# Patient Record
Sex: Female | Born: 1970
Health system: Southern US, Community
[De-identification: ages and names within clinical notes are randomized; demographics above are authoritative.]

## PROBLEM LIST (undated history)

## (undated) DIAGNOSIS — A6009 Herpesviral infection of other urogenital tract: Secondary | ICD-10-CM

## (undated) DIAGNOSIS — F419 Anxiety disorder, unspecified: Secondary | ICD-10-CM

## (undated) DIAGNOSIS — C801 Malignant (primary) neoplasm, unspecified: Secondary | ICD-10-CM

## (undated) HISTORY — DX: Anxiety disorder, unspecified: F41.9

## (undated) HISTORY — PX: TUBAL LIGATION: SHX77

## (undated) HISTORY — DX: Malignant (primary) neoplasm, unspecified: C80.1

## (undated) HISTORY — PX: ANTERIOR CRUCIATE LIGAMENT REPAIR: SHX115

---

## 1993-03-17 DIAGNOSIS — C801 Malignant (primary) neoplasm, unspecified: Secondary | ICD-10-CM

## 1993-03-17 HISTORY — DX: Malignant (primary) neoplasm, unspecified: C80.1

## 2005-07-14 ENCOUNTER — Inpatient Hospital Stay (HOSPITAL_COMMUNITY): Admission: AD | Admit: 2005-07-14 | Discharge: 2005-07-23 | Payer: Self-pay | Admitting: Gynecology

## 2005-07-14 ENCOUNTER — Ambulatory Visit: Payer: Self-pay | Admitting: Neonatology

## 2005-07-14 ENCOUNTER — Ambulatory Visit: Payer: Self-pay | Admitting: Family Medicine

## 2005-07-19 ENCOUNTER — Encounter (INDEPENDENT_AMBULATORY_CARE_PROVIDER_SITE_OTHER): Payer: Self-pay | Admitting: Specialist

## 2005-07-28 ENCOUNTER — Ambulatory Visit: Payer: Self-pay | Admitting: Family Medicine

## 2005-08-27 ENCOUNTER — Ambulatory Visit: Payer: Self-pay | Admitting: Obstetrics and Gynecology

## 2005-11-20 ENCOUNTER — Ambulatory Visit: Payer: Self-pay | Admitting: Obstetrics and Gynecology

## 2006-09-10 ENCOUNTER — Encounter (INDEPENDENT_AMBULATORY_CARE_PROVIDER_SITE_OTHER): Payer: Self-pay | Admitting: Obstetrics & Gynecology

## 2006-09-10 ENCOUNTER — Ambulatory Visit: Payer: Self-pay | Admitting: Obstetrics & Gynecology

## 2007-10-13 ENCOUNTER — Encounter: Payer: Self-pay | Admitting: Obstetrics & Gynecology

## 2007-10-13 ENCOUNTER — Ambulatory Visit: Payer: Self-pay | Admitting: Obstetrics & Gynecology

## 2010-04-12 ENCOUNTER — Other Ambulatory Visit: Payer: Self-pay | Admitting: Family Medicine

## 2010-04-12 DIAGNOSIS — Z139 Encounter for screening, unspecified: Secondary | ICD-10-CM

## 2010-04-18 ENCOUNTER — Other Ambulatory Visit: Payer: Self-pay | Admitting: Internal Medicine

## 2010-04-18 DIAGNOSIS — R102 Pelvic and perineal pain unspecified side: Secondary | ICD-10-CM

## 2010-04-19 ENCOUNTER — Ambulatory Visit (HOSPITAL_COMMUNITY): Admission: RE | Admit: 2010-04-19 | Payer: Self-pay | Source: Home / Self Care | Admitting: Family Medicine

## 2010-04-19 ENCOUNTER — Other Ambulatory Visit (HOSPITAL_COMMUNITY): Payer: Self-pay

## 2010-04-19 ENCOUNTER — Ambulatory Visit (HOSPITAL_COMMUNITY)
Admission: RE | Admit: 2010-04-19 | Discharge: 2010-04-19 | Disposition: A | Discharge status: Home / Self Care | Payer: 59 | Source: Ambulatory Visit | Attending: Internal Medicine | Admitting: Internal Medicine

## 2010-04-19 ENCOUNTER — Ambulatory Visit (HOSPITAL_COMMUNITY)
Admission: RE | Admit: 2010-04-19 | Discharge: 2010-04-19 | Disposition: A | Discharge status: Home / Self Care | Payer: 59 | Source: Ambulatory Visit | Attending: Family Medicine | Admitting: Family Medicine

## 2010-04-19 DIAGNOSIS — Z1231 Encounter for screening mammogram for malignant neoplasm of breast: Secondary | ICD-10-CM

## 2010-04-19 DIAGNOSIS — Z139 Encounter for screening, unspecified: Secondary | ICD-10-CM

## 2010-04-19 DIAGNOSIS — R102 Pelvic and perineal pain: Secondary | ICD-10-CM

## 2010-04-22 ENCOUNTER — Other Ambulatory Visit: Payer: Self-pay

## 2010-04-24 ENCOUNTER — Other Ambulatory Visit: Payer: Self-pay | Admitting: Internal Medicine

## 2010-04-24 DIAGNOSIS — R1031 Right lower quadrant pain: Secondary | ICD-10-CM

## 2010-04-26 ENCOUNTER — Ambulatory Visit
Admission: RE | Admit: 2010-04-26 | Discharge: 2010-04-26 | Disposition: A | Discharge status: Home / Self Care | Payer: 59 | Source: Ambulatory Visit | Attending: Internal Medicine | Admitting: Internal Medicine

## 2010-04-26 DIAGNOSIS — R1031 Right lower quadrant pain: Secondary | ICD-10-CM

## 2010-04-26 MED ORDER — IOHEXOL 300 MG/ML  SOLN
125.0000 mL | Freq: Once | INTRAMUSCULAR | Status: AC | PRN
  Administered 2010-04-26: 125 mL via INTRAVENOUS

## 2010-04-30 ENCOUNTER — Other Ambulatory Visit: Payer: Self-pay | Admitting: Internal Medicine

## 2010-04-30 DIAGNOSIS — E348 Other specified endocrine disorders: Secondary | ICD-10-CM

## 2010-05-01 ENCOUNTER — Ambulatory Visit
Admission: RE | Admit: 2010-05-01 | Discharge: 2010-05-01 | Disposition: A | Discharge status: Home / Self Care | Payer: 59 | Source: Ambulatory Visit | Attending: Internal Medicine | Admitting: Internal Medicine

## 2010-05-01 DIAGNOSIS — E348 Other specified endocrine disorders: Secondary | ICD-10-CM

## 2010-05-01 MED ORDER — GADOBENATE DIMEGLUMINE 529 MG/ML IV SOLN
20.0000 mL | Freq: Once | INTRAVENOUS | Status: AC | PRN
  Administered 2010-05-01: 20 mL via INTRAVENOUS

## 2010-05-03 ENCOUNTER — Other Ambulatory Visit: Payer: 59

## 2010-05-06 ENCOUNTER — Other Ambulatory Visit (HOSPITAL_COMMUNITY)
Admission: RE | Admit: 2010-05-06 | Discharge: 2010-05-06 | Disposition: A | Discharge status: Home / Self Care | Payer: 59 | Source: Ambulatory Visit | Attending: Gynecology | Admitting: Gynecology

## 2010-05-06 DIAGNOSIS — Z124 Encounter for screening for malignant neoplasm of cervix: Secondary | ICD-10-CM | POA: Insufficient documentation

## 2010-05-07 ENCOUNTER — Ambulatory Visit (INDEPENDENT_AMBULATORY_CARE_PROVIDER_SITE_OTHER): Payer: 59 | Admitting: Gynecology

## 2010-05-07 ENCOUNTER — Other Ambulatory Visit: Payer: Self-pay | Admitting: Gynecology

## 2010-05-07 DIAGNOSIS — Z1211 Encounter for screening for malignant neoplasm of colon: Secondary | ICD-10-CM

## 2010-05-07 DIAGNOSIS — N912 Amenorrhea, unspecified: Secondary | ICD-10-CM

## 2010-05-07 DIAGNOSIS — Z124 Encounter for screening for malignant neoplasm of cervix: Secondary | ICD-10-CM

## 2010-05-07 DIAGNOSIS — N949 Unspecified condition associated with female genital organs and menstrual cycle: Secondary | ICD-10-CM

## 2010-05-07 DIAGNOSIS — C530 Malignant neoplasm of endocervix: Secondary | ICD-10-CM

## 2010-05-07 DIAGNOSIS — R635 Abnormal weight gain: Secondary | ICD-10-CM

## 2010-05-17 ENCOUNTER — Institutional Professional Consult (permissible substitution) (INDEPENDENT_AMBULATORY_CARE_PROVIDER_SITE_OTHER): Payer: 59 | Admitting: Gynecology

## 2010-05-17 DIAGNOSIS — R1031 Right lower quadrant pain: Secondary | ICD-10-CM

## 2010-05-17 DIAGNOSIS — N912 Amenorrhea, unspecified: Secondary | ICD-10-CM

## 2010-06-17 ENCOUNTER — Ambulatory Visit (INDEPENDENT_AMBULATORY_CARE_PROVIDER_SITE_OTHER): Payer: 59 | Admitting: Gynecology

## 2010-06-17 ENCOUNTER — Other Ambulatory Visit: Payer: 59

## 2010-06-17 DIAGNOSIS — D3910 Neoplasm of uncertain behavior of unspecified ovary: Secondary | ICD-10-CM

## 2010-06-17 DIAGNOSIS — N949 Unspecified condition associated with female genital organs and menstrual cycle: Secondary | ICD-10-CM

## 2010-06-17 DIAGNOSIS — N8300 Follicular cyst of ovary, unspecified side: Secondary | ICD-10-CM

## 2010-06-17 DIAGNOSIS — R1031 Right lower quadrant pain: Secondary | ICD-10-CM

## 2010-06-20 ENCOUNTER — Other Ambulatory Visit: Payer: Self-pay | Admitting: Gynecology

## 2010-06-20 ENCOUNTER — Ambulatory Visit (HOSPITAL_BASED_OUTPATIENT_CLINIC_OR_DEPARTMENT_OTHER)
Admission: RE | Admit: 2010-06-20 | Discharge: 2010-06-20 | Disposition: A | Discharge status: Home / Self Care | Payer: 59 | Source: Ambulatory Visit | Attending: Gynecology | Admitting: Gynecology

## 2010-06-20 DIAGNOSIS — N736 Female pelvic peritoneal adhesions (postinfective): Secondary | ICD-10-CM

## 2010-06-20 DIAGNOSIS — R1031 Right lower quadrant pain: Secondary | ICD-10-CM | POA: Insufficient documentation

## 2010-06-20 DIAGNOSIS — Z01810 Encounter for preprocedural cardiovascular examination: Secondary | ICD-10-CM | POA: Insufficient documentation

## 2010-06-20 DIAGNOSIS — Z302 Encounter for sterilization: Secondary | ICD-10-CM

## 2010-06-20 DIAGNOSIS — N949 Unspecified condition associated with female genital organs and menstrual cycle: Secondary | ICD-10-CM

## 2010-06-20 DIAGNOSIS — Z01812 Encounter for preprocedural laboratory examination: Secondary | ICD-10-CM | POA: Insufficient documentation

## 2010-06-28 NOTE — Op Note (Signed)
[name]  [name], [name]              ACCOUNT NO.:  [id number]  MEDICAL RECORD NO.:  [id number]          PATIENT TYPE:  O  LOCATION:  InputValueNotLocate          FACILITY:  [facility]  PHYSICIAN:  [name] [name], M.D.DATE OF BIRTH:  04-12-1970  DATE OF PROCEDURE: DATE OF DISCHARGE:  05/06/2010                              OPERATIVE REPORT   SURGEON:  Shiree Altemus H.  Lily Peer, MD  FIRST ASSISTANT: 1. Daniel L. Eda Paschal, M.D. 2. Mary Sella. [name], MD  HISTORY:  The patient is a 40 year old with chronic right lower abdominal pain and was requesting also to have elective sterilization. Questionable left ovarian cyst on ultrasound recently.  Abdominal CT questionable nodular area anterior abdominal wall.  PREOPERATIVE DIAGNOSES: 1. Chronic right lower abdominal pain. 2. Questionable left ovarian cyst. 3. Elective sterilization. 4. Nodular area in the anterior abdominal wall.  POSTOPERATIVE DIAGNOSES: 1. Anterior abdominal omental adhesions. 2. Left pelvic sidewall adhesions.  PROCEDURES PERFORMED: 1. Diagnostic laparoscopy. 2. Lysis of abdominal omental adhesions. 3. Lysis of left pelvic sidewall adhesions. 4. Bilateral tubal sterilization procedure.  ANESTHESIA:  General endotracheal anesthesia.  FINDINGS:  The patient had an omental attachment to the right lower anterior abdominal wall, left pelvic sidewall adhesions of the tube, ovary and round ligament on the left.  Otherwise normal-appearing tubes and ovaries bilateral.  Anterior and posterior cul-de-sac free of adhesions or endometriotic implants.  Inspection of the appendix appeared to be normal.  Gallbladder and liver surfaces appeared to be normal.  DESCRIPTION OF OPERATION:  After the patient was adequately counseled, she was taken to the operating room where she underwent successful general endotracheal anesthesia.  She had PSA stockings for DVT prophylaxis and received a gram of cefotetan for prophylaxis as well. She  was placed in the lithotomy position.  The abdomen, vagina and perineum were prepped and draped in the usual sterile fashion.  Bimanual examination had demonstrated an anteverted uterus.  A single-tooth tenaculum was placed for manipulation during the laparoscopic procedure and a Foley catheter was placed as well.  Prior to commencing the operation before the patient was put to sleep, Dr. [name] had spoken to the patient and the area on the anterior abdominal wall was marked with a marking pen, marking the point of tenderness that the patient's major complaint has been describing.  After general endotracheal anesthesia had been obtained and the drapes were in place and the patient had been prepped, a small semilunar incision was made under the umbilicus.  The 10/11- mm trocar was then inserted into the peritoneal cavity and the pneumoperitoneum was established with approximately 3 liters of carbon dioxide.  Two additional ports with 5-mm trocars were inserted in the patient's right and left lower abdomen.  A thorough inspection of the pelvic cavity had demonstrated the left pelvic sidewall adhesions from the tubes to the round ligament and the left side of the uterus.  With the Harmonic scalpel these adhesions were coapted and transected. Inspection of both tubes and ovary appeared to be otherwise normal as well as the anterior and posterior cul-de-sac.  The proximal one-third of the left fallopian tube was grasped, coapted and transected with the Harmonic scalpel, transecting the fallopian tube on the left and a similar procedure  was carried out on the contralateral right tube. Inspection of the appendix appeared to be normal grossly as well as the external surface of the gallbladder and the liver.  There appeared to be no perihepatic adhesions there.  We then put a 5-mm scope through the left lower port to look at the anterior abdominal surface and there was an omentum adhesion adhered  to the point near where the patient was experiencing her discomfort.  Dr. [name], the general surgeon, had stepped in for consultation.  He probed his finger through the subumbilical incision and could feel this area but then it was freed with the Harmonic scalpel, whereby it was coapted, transected with the Harmonic scalpel and freed.  He did not feel any defect at that point and felt that this was probably the point that the patient was referring to as her pain and thought that the benefits did not outweigh the risk of doing an abdominal exploration at this point, since this was probably the source of her pain, so the case was finished at this point. Pictures were obtained preoperatively and postoperatively.  Some of the adhesions from the anterior abdominal wall as well as the left pelvic sidewall were submitted for histological evaluation.  The area where the omentum adhesion had been transected with a Kleppinger bipolar forceps, the area was cauterized in the event that there may be underlying endometriosis.  The patient tolerated the procedure well.  The pneumoperitoneum was removed.  The subumbilical fascial incision was closed with a figure-of-eight of 0 Vicryl suture and all three port site skin incisions were approximated with Dermabond glue.  For postoperative analgesia 0.25% Marcaine was infiltrated at all three port sites.  The Hulka tenaculum was then removed.  There was some slight oozing from the tenaculum and it was contained with silver nitrate.  Otherwise no other problems and the Foley catheter was removed.  The patient tolerated the procedure well, was extubated, and transferred to the recovery room with stable vital signs.  Blood loss was minimal.  Urine output was 75 cc. IV fluids consisted of 2000 cc of lactated Ringer's.  She received 30 mg of Toradol en route to the recovery room.     Callaway Hardigree [name], M.D.     JHF/MEDQ  D:  06/20/2010  T:  06/20/2010   Job:  [id number]  cc:   Mary Sella. [name], MD [address] [place] [address]  Electronically Signed by Reynaldo Minium M.D. on 06/28/2010 09:43:35 AM

## 2010-06-28 NOTE — H&P (Signed)
NAMEMarland Kitchen  KANG, ISHIDA              ACCOUNT NO.:  [id number]  MEDICAL RECORD NO.:  [id number]          PATIENT TYPE:  O  LOCATION:                               FACILITY:  [facility]  PHYSICIAN:  Miken Stecher [name], M.D.DATE OF BIRTH:  [birth year]  DATE OF ADMISSION: DATE OF DISCHARGE:                             HISTORY & PHYSICAL   The patient is scheduled for surgery on Thursday, April 5 at 7:30 a.m. at [facility].  Please have physical available.  CHIEF COMPLAINT:  Right lower abdominal pain.  HISTORY:  The patient is a 40 year old gravida 1, para 1 who was seen in the office on February 21 and had been referred from the [facility] due to the fact that as a result of 3 weeks of right lower quadrant pain.  The patient had a CT and MRI of the abdomen and only hemangioma of the liver was noted.  The patient continued to have right lower quadrant pain and suffered from oligomenorrhea.  We had checked her TSH and prolactin, which were normal as was her CBC and we had given her Provera to withdraw and to initiate a 20 mcg oral contraceptive pill to regulate her cycle and maybe suppress any underlying endometriosis.  The patient continued to have pain regardless and wanted further evaluation so we discussed about proceeding with a diagnostic laparoscopy, possible right salpingo-oophorectomy and even discussed with Dr. [name], the general surgeon who had seen her, and he did not think that she had any underlying appendicitis because it was not noted on the CT or MRI recently but he did review the films and thought that there was a soft tissue mass in the area of the anterior abdominal wall near the muscle and he was going to excise that.  We are going to proceed with a diagnostic laparoscopy to rule out endometriosis.  She did have an ultrasound here in the office today which demonstrated a uterus that measured 8.7 x 6.2 x 4.9 cm with an endometrial stripe of 7.3  mm, an intramural myoma measuring 17 x 15 x 16 mm was noted, right ovary had follicle 12 mm.  No apparent mass on the right adnexa.  Left ovary a thin wall homogeneous diffuse low-level echo mass measuring 27 x 16 x 17 mm, negative color-flow.  The patient presented to the office today for a discussion preoperatively and the plan is to proceed then with a diagnostic laparoscopy, possible left ovarian cystectomy, possible right salpingo-oophorectomy and only an appendectomy if any abnormalities noted and the general surgeon with follow up on that and then Dr. [name] then will proceed with the anterior abdominal muscle wall lesion that he had noted on the MRI.  ALLERGIES:  She denies any allergies.  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  FAMILY HISTORY:  Father with diabetes.  Parents and grandparents had hypertension, cardiovascular disease and a nephew with lymphoma.  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  PHYSICAL EXAMINATION:  VITAL SIGNS:  The patient weighs 220 pounds, 5 feet 5 inches tall, BMI of 37.  Blood pressure 118/74. HEENT:  Unremarkable. NECK:  Supple.  Trachea midline.  No carotid bruits.  No thyromegaly. LUNGS:  Clear to auscultation without rhonchi or wheezes. HEART:  Regular rate and rhythm.  No murmurs, rubs, or gallops. BREASTS:  Examination unremarkable. ABDOMEN:  Soft, tender in the right lower abdomen and on bimanual examination despite a normal palpable ovary on the right and minimal tenderness on the left. RECTAL:  Examination deferred.  ASSESSMENT:  A 40 year old gravida 1, para 1 with persistence of right lower abdominal pain and negative MRI and CT with the exception of a questionable granulomatous-like  area on the anterior abdominal wall which Dr. [name] will excise after proceeding with a diagnostic laparoscopy to rule out any pelvic endometriosis.  I discussed with Ms. Waddle that we may end up doing a left ovarian cystectomy and/or right salpingo-oophorectomy and also ablation of endometriotic implants.  She is 40 years of age and is not interested in having any more children so that will not be an issue.  We discussed the potential risk of hemorrhage and if she would need blood or blood products, the risk of anaphylactic reactions and hepatitis and AIDS were discussed.  We also discussed the risk of deep vein thrombosis and subsequent pulmonary embolism, so she will have PSA stockings for prophylaxis as well and she will receive prophylaxis antibiotic as well.  Also the risk of trauma injury to internal organs were discussed and Dr. [name] will discuss his part and the abdominal wall exploration and the risks involved in that operation as well.  All of these issues were discussed with the patient in detail, all questions were answered and we will follow accordingly.  PLAN:  The patient is scheduled for diagnostic laparoscopy, possible right salpingo-oophorectomy, possible left ovarian cystectomy and ablation of endometriotic implants and Dr. [name] to follow with the exploration of the abdominal wall on Thursday, April 5 at 7:30 a.m. at [facility].  Please have history and physical available.     Gaberial Cada [name], M.D.     JHF/MEDQ  D:  06/17/2010  T:  06/17/2010  Job:  [id number]  Electronically Signed by Reynaldo Minium M.D. on 06/28/2010 09:43:16 AM

## 2010-07-04 ENCOUNTER — Ambulatory Visit (INDEPENDENT_AMBULATORY_CARE_PROVIDER_SITE_OTHER): Payer: 59 | Admitting: Gynecology

## 2010-07-04 ENCOUNTER — Ambulatory Visit: Payer: 59 | Admitting: Gynecology

## 2010-07-04 DIAGNOSIS — R635 Abnormal weight gain: Secondary | ICD-10-CM

## 2010-07-24 NOTE — Op Note (Signed)
[name], [name]              ACCOUNT NO.:  [id number]  MEDICAL RECORD NO.:  [id number]          PATIENT TYPE:  LOCATION:                                 FACILITY:  PHYSICIAN:  [name] Sella. [name], MD     DATE OF BIRTH:  1971-02-07  DATE OF PROCEDURE:  06/20/2010 DATE OF DISCHARGE:                              OPERATIVE REPORT   SURGEON:  [name] Sella. [name], MD  ASSISTANT:  [name] Hawthorne. Lily Peer, MD  PREOPERATIVE DIAGNOSIS:  Right abdominal pain.  POSTOPERATIVE DIAGNOSIS:  Right abdominal pain.  PROCEDURE:  Laparoscopic lysis of adhesions for 5 minutes.  COMPLICATIONS:  None immediately apparent.  SPECIMEN:  Omentum from the anterior abdominal wall, rule out endometriosis.  INDICATIONS FOR PROCEDURE:  The patient is a 40 year old Caucasian female who has had right lower abdominal pain for some time.  She has undergone CT scan and transvaginal ultrasound.  She had been seen by Dr. Lily [name] and sent to me as well for consideration for an appendectomy. He was planning to do diagnostic laparoscopy to look for signs of endometriosis.  When I saw her in the office, there was an abnormality that I palpated on her right abdominal wall, slightly below the level of umbilicus and about 2 fingerbreadths below the umbilicus.  There was a very subtle nodularity to it.  Upon reviewing her CAT scan, there appeared to be slight bump as well into the right rectus muscle that corresponded to this palpable abnormality.  Her appendix was normal on the CAT scan.  This bump at the right rectus muscle was in continuation with rectus muscle, was not a separate mass or structure.  There was no signs of hematoma or abscess.  It was just more of a slight peak to the right rectus muscle.  So, we talked about possible abdominal wall exploration at the time of her diagnostic laparoscopy with possible excision of this abnormality.  This is going to be done in combination with Dr. Lily [name] as a diagnostic  laparoscopy.  We discussed the risks and benefits of surgery including bleeding, infection, injury to surrounding structure, hematoma formation, seroma formation, scarring, DVT occurrence, failure to ameliorate her abdominal pain as well as scarring.  We also talked about the rare possibility of an incisional hernia.  She elected to proceed to the operating room.  DESCRIPTION OF PROCEDURE:  Please see Dr. Fontaine No operative note for key portions of the procedure.  Dr. Lily [name] and his partner had already started the laparoscopic portion of the procedure and were in the process of evaluating her pelvic organs.  I entered the operating room.  We identified the cecum and evaluated the appendix.  The appendix was completely normal without any signs of irregularity or wall thickening or enlargement and is not in an atypical location.  Upon viewing her right lower abdominal wall, there is a band of omental fat, which was adhesed to her right lower quadrant abdominal wall directly corresponding to the area of point tenderness on her anterior abdominal wall.  In the preop holding area, I had confirmed the location of her tenderness which was in the right lower  abdominal wall, and I marked it with a marking pen.  This omental attachment corresponded directly to the same location internally.  Once Dr. Lily [name] and his partner were done with their pelvic surgery, I scrubbed in and assisted Dr. Lily [name].  I used Harmonic scalpel to lyse that omental attachment from her abdominal wall.  We did obtain several pieces of the omentum as a specimen to send to pathology and to evaluate for endometriosis.  We carefully inspected her anterior abdominal wall.  There are no signs of hernia there.  There is just a small gap in the peritoneum.  There was no signs of bleeding in the anterior abdominal wall.  There was no signs of bowel injury in the right lower quadrant.  At this point, I scrubbed out  procedure and Dr. Lily [name] closed the abdomen and trocar sites after we had released pneumoperitoneum.  Prior to me scrubbing out, I palpated her abdominal wall directly over the area and in the right lower quadrant using deep palpation.  I could no longer really appreciate that subtle nodularity that I had felt in my office.  I placed my fingers through her infraumbilical trocar incision and palpated the right rectus muscle inferiorly and I felt no palpable abnormality.  There was nothing within her subcutaneous tissue that I could feel.  I therefore since we had found the abnormality within fat of her abdomen, I elected not to incise the right lower quadrant abdominal wall.  Please see Dr. Fontaine No op note for the remainder of the operative report.     [name] Sella. [name], MD     EMW/MEDQ  D:  06/20/2010  T:  06/20/2010  Job:  [id number]  Electronically Signed by [name] [name] M.D. on 07/24/2010 02:59:37 PM

## 2010-07-30 NOTE — Group Therapy Note (Signed)
[name], [name] NO.:  [id number]   MEDICAL RECORD NO.:  [id number]          PATIENT TYPE:  WOC   LOCATION:  [facility]                   FACILITY:  [facility]   PHYSICIAN:  Johnella Moloney, MD        DATE OF BIRTH:  1970-11-24   DATE OF SERVICE:  10/13/2007                                  CLINIC NOTE   CHIEF COMPLAINT:  Annual exam, irregular bleeding.   HISTORY OF PRESENT ILLNESS:  The patient is a 40 year old G1, Para 1-0-  1, who is here for her annual exam.  The patient reports having abnormal  bleeding in between periods which last for 1 to 2 days at a time, not as  heavy as her usual menstrual periods but more than spotting.  She has  noted this for a few months.  Of note, the patient is on Necon 10/11  (28) and is interested in switching birth control to Seasonal to have  fewer periods in a year.  The patient denies any other concerns.   PAST OB/GYN HISTORY:  The patient had PPROM at 25 weeks and ended up  with a Cesarean section for this delivery with a female infant.  Of note  during this pregnancy, she was diagnosed with Chlamydia and was treated  for that.  The patient denies any other sexually transmitted diseases.  She has was normal menstrual periods and abnormal bleeding as noted in  HPI.  She has no problems during sexual activity.  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 but normal recent Pap smears.   PAST MEDICAL HISTORY:  1. Chlamydia.  2. Anemia during pregnancy.   PAST SURGICAL HISTORY:  1. D&C cone.  2. Cesarean section.   MEDICATIONS:  Necon.   ALLERGIES:  NO KNOWN DRUG ALLERGIES.   SOCIAL HISTORY:  The patient lives at home with her husband and son.  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  She  denies any tobacco or illicit drug use and drinks 1 to 2 glasses of wine  per week.   FAMILY HISTORY:  Negative for any breast, colon, or other gynecologic  cancers.   REVIEW OF SYSTEMS:   Otherwise negative apart from what is mentioned in  the HPI.   PHYSICAL EXAMINATION:  Temperature 98.1, pulse 75, blood pressure  114/79, respirations 20, weight 174.4 pounds, height 5 feet, 6 inches.  GENERAL:  In no apparent distress.  LUNGS:  Clear to auscultation bilaterally.  HEART:  Regular rate and rhythm.  BREAST EXAMINATION:  Symmetric.  No abnormal masses, skin changes or  drainage noted.  No axillary lymphadenopathy.  ABDOMEN:  Soft, nontender, nondistended.  PELVIC EXAM:  Normal external female genitalia.  Pink, well rugated  vagina with a small amount of white discharge noted in the vaginal  vault, normal cervical contour with no abnormal drainage.  No lesions  noted.  Pap smear sample obtained from the cervix.  On bimanual exam,  the patient has a small anteverted uterus, mobile, nontender, normal  ovaries palpated.  No abnormal adnexal tenderness or masses palpated.  EXTREMITIES:  No cyanosis, clubbing or edema.   ASSESSMENT/PLAN:  The patient is a 40 year old Gravida 1, Para 0-1-0-1  here for annual exam.  The patient had a normal breast examination and  will not be due for mammograms until age 5.  She also had a Pap smear  today.  We will follow up on the results.  The patient also reported  having abnormal bleeding.  This is likely due to her multi-phasic oral  contraceptive pill.  She was told that a switch over to Seasonal, which  is a monophasic pill, will help in alleviating the symptoms.  She was  also told to start this new medication today and to expect that for the  first few cycles she might have some breakthrough bleeding as her body  gets used to cycling every 3 months.  The patient was told to call for  any further GYN concerns.           ______________________________  Johnella Moloney, MD     UD/MEDQ  D:  10/13/2007  T:  10/13/2007  Job:  [id number]

## 2010-07-30 NOTE — Group Therapy Note (Signed)
[name] [name], BERHOW NO.:  [id number]   MEDICAL RECORD NO.:  [id number]          PATIENT TYPE:  WOC   LOCATION:  [facility]                   FACILITY:  [facility]   PHYSICIAN:  Dorthula Perfect, MD     DATE OF BIRTH:  [birth year]   DATE OF SERVICE:  09/10/2006                                  CLINIC NOTE   A 40 year old white female gravida 1, para 1; last menstrual period August 19, 2006; is seen here today for Pap smear and birth control.  In  addition, she has been having occasional hurting in her right side.   She has been on Necon for birth control.  She has breakthrough bleeding  mid cycle which occasionally does bother her.  She has no GI or GU  complaints.   Apparently, she had a cesarean section in May of last year at 25 weeks 3  days.  She had a low abdominal transverse incision.  She states,  however, that in the uterus she had both a sideways cut as well as up  and down.  She has been advised to have cesarean sections in the future.   The patient has a problem with migraine headaches and needs to get in  with a doctor here in town.  She moved here a year ago.   PHYSICAL EXAMINATION:  Height 5 feet 6 inches, weight 157.  Blood  pressure 105/69.  Thyroid is normal.  Abdomen is soft and nontender.  The low abdominal transverse scar is above the suprapubic area.  It is  nontender. I feel no masses.  Likewise, she has no complaints referable  to her gallbladder.  PELVIC:  External genitalia and BUS glands are normal.  Vaginal wall is  epithelialized as was the cervix.  Uterus is in the midline and is of  normal size and shape.  Adnexal areas are normal.   IMPRESSION:  1. Normal gynecologic examination.  2. Contraception - oral.  3. History of migraine headaches.   DISPOSITION:  1. Thin-Prep Pap smear.  2. I have given her a prescription for Necon 10/11, which should help      with the midcycle breakthrough bleeding.  I have given refills for      a  year.  3. I have recommended, as long as [employer] will allow it,      that she consider being seen in the [facility] for      ongoing care about her migraines.           ______________________________  Dorthula Perfect, MD    ER/MEDQ  D:  09/10/2006  T:  09/10/2006  Job:  [id number]

## 2010-08-02 NOTE — Discharge Summary (Signed)
[name], [name]            ACCOUNT NO.:  [id number]   MEDICAL RECORD NO.:  [id number]          PATIENT TYPE:  INP   LOCATION:  9306                          F[facility]ILITY:  [facility]   PHYSICIAN:  Altamese Cabal, M.D.  DATE OF BIRTH:  Oct 16, 1970   DATE OF ADMISSION:  07/14/2005  DATE OF DISCHARGE:  07/23/2005                                 DISCHARGE SUMMARY   DISCHARGE DIAGNOSES:  1.  Primary vertical classical cesarean section at 25 weeks and 2 days      secondary to preterm premature rupture of membranes, non-reassuring      fetal heart tones, meconium, and breech presentation.  2.  Likely chorioamnionitis.   HOSPITAL COURSE:  This patient is a 40 year old G1, P0-1-0-1 who presented  here at 24 weeks and 5 days and was diagnosed with rupture of membranes.  Patient was also found to have a Chlamydia infection and GBS positive.  These were both treated with azithromycin and then patient was continued on  Unasyn.  The patient was also given two betamethasone steroid injections as  well as RhoGAM since she is Rh negative.  She was monitored for about five  days.  The baby did have occasional variable decelerations.  These became  persistent on Jul 19, 2005 and the baby became more progressively  tachycardic.  Mom developed a temperature and thick meconium was noted on  examination.  The mother was taken back for cesarean section.  She was now  25 weeks and 3 days.  The baby's Apgars at delivery were 7 at one minute and  7 at five minutes.  It was a viable female.  NICU team was present at the  delivery.  Blood loss was estimated at 1000 mL.  Placenta had a three vessel  cord.  Mother and baby tolerated the procedure well.  Mom was discharged  from the hospital on postoperative day #3-1/2.  She will start a NuvaRing in  two weeks for contraception.  Baby remains in the NICU.  Mom's blood type is  A-.  The baby's was B-.  Mother's hemoglobin was 10.2.  We made the decision  to send her home  on antibiotics as she was at high risk for developing  infection after having chorioamnionitis.  She was instructed to return if  she develops fever, abdominal pain.  An appointment was set up on Monday,  May 14 for her staples to be removed.  She will also follow up at the GYN  Clinic in six weeks for a postpartum check.  Mom is in the process of  relocating to [place].  She works here, but currently lives in [place].  No postoperative complications noted in the mother.  She is to restrict any  lifting over 10 pounds.  Regular diet.   DISCHARGE MEDICATIONS:  1.  Percocet 5/325 mg one tablet p.o. q.4-6h. p.r.n. pain.  2.  NuvaRing one ring per vagina x3 weeks.  3.  Augmentin 500/125 mg one tablet p.o. q.8h. x7 days.  4.  Prenatal vitamins one tablet p.o. daily.  5.  Ibuprofen 600 mg p.o. q.6h. p.r.n. cramps.  6.  Colace 100 mg p.o. b.i.d. for constipation.   FOLLOW-UP INSTRUCTIONS:  As above.      Altamese Cabal, M.D.    KS/MEDQ  D:  07/23/2005  T:  07/24/2005  Job:  [id number]

## 2010-08-02 NOTE — Group Therapy Note (Signed)
NAMEORIANA, HORIUCHI NO.:  [id number]   MEDICAL RECORD NO.:  [id number]          PATIENT TYPE:  WOC   LOCATION:  [facility]                   FACILITY:  [facility]   PHYSICIAN:  Argentina Donovan, MD        DATE OF BIRTH:  11/19/1970   DATE OF SERVICE:  08/27/2005                                    CLINIC NOTE   CHIEF COMPLAINT:  Postpartum checks.   SUBJECTIVE:  Mrs. [name] [name] is a 40 year old patient G1, para 1-1-0-1, coming  for her postpartum check. The patient had a cesarean section performed on  Jul 19, 2005 at 25 weeks and 3 days gestational age secondary to pre-term  premature rupture of membranes. The patient had normal lochia after cesarean  section and staples were removed at the clinic on Jul 28, 2005. The patient  comes in today asymptomatic, no complaints. She returned her menstrual  period on August 24, 2005. The patient also due for her pap smear. The patient  also desires tubes tied contraception.   PHYSICAL EXAMINATION:  GENERAL:  No acute distress. Well nourished.  CARDIOVASCULAR:  S1 and S2 normal. RRR. No murmur, rub, or gallop.  CHEST:  Clear to auscultation bilaterally.  ABDOMEN:  Below transverse scar from cesarean section, well healed. No  erythema. Positive bowel sounds. Non-tender, non-distended. No palpable  masses.  GENITOURINARY:  External genitalia within normal limits. Bi-manual  examination performed. No cervical motion tenderness. Uterus retracted. No  uterine or adnexal masses.  EXTREMITIES:  No edema.   ASSESSMENT/PLAN:  1.  Six weeks postpartum examination, within normal limits.  2.  Hemoglobin and hematocrit and CBC will be checked today.  3.  For contraception, the patient used medroxyprogesterone 150 mg, third      dose given today, to repeat q.3 months.  4.  The patient will have followup appointment in 2 weeks to perform pap      smear and also test of cure for Chlamydia, which was positive and      treated on July 14, 2005.  __________           ______________________________  Argentina Donovan, MD     PR/MEDQ  D:  08/27/2005  T:  08/27/2005  Job:  [id number]

## 2010-08-02 NOTE — Op Note (Signed)
[name] [name], MANTIA NO.:  [id number]   MEDICAL RECORD NO.:  [id number]          PATIENT TYPE:  INP   LOCATION:  9306                          [facility]   PHYSICIAN:  Lesly Dukes, M.D. DATE OF BIRTH:  25-Oct-1970   DATE OF PROCEDURE:  DATE OF DISCHARGE:                                 OPERATIVE REPORT   PREOPERATIVE DIAGNOSIS:  40 year old female at 35 week estimated gestational  age chorioamnionitis, meconium and breech presentation.   POSTOPERATIVE DIAGNOSIS:  40 year old female at 31 week estimated  gestational age chorioamnionitis, meconium and breech presentation.   OPERATION PERFORMED:  Classical cesarean section.   SURGEON:  Lesly Dukes, M.D.   ASSISTANT:  __________   ANESTHESIA:  Spinal.   PATHOLOGY:  Placenta.   ESTIMATED BLOOD LOSS:  1000 mL.   COMPLICATIONS:  None.   FINDINGS:  Viable female infant, Apgars 7 at one minute and 7 at five minutes.  Meconium stained fluid.  Oligohydramnios.  Complete breech presentation.  Grossly normal uterus, ovaries and fallopian tubes.  NICU team at delivery.  Arterial and venous cord gases were 7.4 and weight is 1 pound 7 ounces.   DESCRIPTION OF PROCEDURE:  After informed consent was obtained, the patient  was taken to the operating room where spinal anesthesia was found to be  adequate.  The patient was placed in dorsal lithotomy position with leftward  tilt and prepped and draped in the normal sterile fashion.  Foley was in the  bladder.  A Pfannenstiel skin incision was made with scalpel and carried  down to fascia.  Fascia was incised in the midline and extended bilaterally.  Superior and inferior aspects of fascial incision were grasped with Kocher  clamps, tented up, and dissected down sharply and bluntly to underlying  layers of rectus muscles.  Rectus muscles were separated in the midline. The  peritoneum was entered bluntly and extended both superiorly and inferiorly  with  __________.  The bladder blade was inserted.  The uterine peritoneum  was divided with __________ and Metzenbaum scissors.  This incision was  extended bilaterally and the bladder flap was created digitally.  Bladder  blade was inserted.  Lower uterine segment was not well developed so a  proximal uterine incision was made.  The baby was delivered as a breech with  some difficulty; however, successfully done and nose and mouth were  suctioned with DeLee.  The cord was clamped and cut, the baby was handed off  to neonate attending.  The placenta was delivered manually with a three  vessel cord.  Arterial and venous cord specimens were sent. Cord blood was  also sent.  Uterus was cleared of all clots and debris and closed in  multiple layers of Vicryl and Monocryl and good hemostasis was noted.  The  abdomen was copiously irrigated and uterus noted to be hemostatic off  tension.  Interceed was placed over the uterine incision.  The peritoneum  was noted to be hemostatic.  Rectus muscle were noted to be hemostatic.  Fascia  was closed with 0 Vicryl in running fashion and noted to  be hemostatic.  Subcutaneous tissue was copiously irrigated and found to be hemostatic.  Skin was closed with staples.  The patient tolerated the procedure well.  Sponge, lap, needle and instruments were all correct times two.  Patient  went to recovery room in stable condition.           ______________________________  Lesly Dukes, M.D.     KHL/MEDQ  D:  07/20/2005  T:  07/21/2005  Job:  [id number]

## 2010-08-09 ENCOUNTER — Ambulatory Visit: Payer: 59 | Admitting: Gynecology

## 2010-10-15 ENCOUNTER — Telehealth: Payer: Self-pay | Admitting: *Deleted

## 2010-10-15 NOTE — Telephone Encounter (Signed)
[facility] SENT FAX  SEE PAPER CHART.PLEASE E-SCIBE RX.

## 2010-10-15 NOTE — Telephone Encounter (Signed)
PER DR. Lily Peer  RX FOR PHENTERMINE 37.5MG  1 PO QD # 30 2 REFILLS ONLY.

## 2011-08-04 ENCOUNTER — Emergency Department (HOSPITAL_BASED_OUTPATIENT_CLINIC_OR_DEPARTMENT_OTHER)
Admission: EM | Admit: 2011-08-04 | Discharge: 2011-08-04 | Disposition: A | Discharge status: Home / Self Care | Payer: 59 | Attending: Emergency Medicine | Admitting: Emergency Medicine

## 2011-08-04 ENCOUNTER — Encounter (HOSPITAL_BASED_OUTPATIENT_CLINIC_OR_DEPARTMENT_OTHER): Payer: Self-pay

## 2011-08-04 DIAGNOSIS — B029 Zoster without complications: Secondary | ICD-10-CM | POA: Insufficient documentation

## 2011-08-04 HISTORY — DX: Herpesviral infection of other urogenital tract: A60.09

## 2011-08-04 MED ORDER — OXYCODONE-ACETAMINOPHEN 5-325 MG PO TABS: 2.0000 | ORAL_TABLET | Freq: Four times a day (QID) | ORAL | Status: AC | PRN

## 2011-08-04 MED ORDER — FAMCICLOVIR 500 MG PO TABS: 500.0000 mg | ORAL_TABLET | Freq: Three times a day (TID) | ORAL | Status: AC

## 2011-08-04 NOTE — Discharge Instructions (Signed)

## 2011-08-04 NOTE — ED Notes (Signed)
Pt c/o rash to L side of face, onset  Yesterday, worsening today.  Pt states it is "tingly and painful" minimal itching.

## 2011-08-04 NOTE — ED Provider Notes (Signed)
History     CSN: [id number]  Arrival date & time 08/04/11  4098   First MD Initiated Contact with Patient 08/04/11 [phone]      Chief Complaint  Patient presents with  . Rash    (Consider location/radiation/quality/duration/timing/severity/associated sxs/prior treatment) HPI This 41 year old female has 2 days of a painful blistering rash in her left lower face but no other symptoms. She is no fever no confusion no trauma no itching no difficulty swallowing no chest pain cough shortness of breath nausea or vomiting. She does not have any involvement of the tip of her nose or high area and no change in vision and no foreign body sensation in her eye. Her pain is moderately severe. There is no treatment prior to arrival. She has no other symptoms. Past Medical History  Diagnosis Date  . Herpes genitalis in women   . Migraine     Past Surgical History  Procedure Date  . Cesarean section   . Tubal ligation     No family history on file.  History  Substance Use Topics  . Smoking status: Never Smoker   . Smokeless tobacco: Not on file  . Alcohol Use: Yes    OB History    Grav Para Term Preterm Abortions TAB SAB Ect Mult Living                  Review of Systems  Constitutional: Negative for fever.       10 Systems reviewed and are negative for acute change except as noted in the HPI.  HENT: Negative for congestion.   Eyes: Negative for discharge and redness.  Respiratory: Negative for cough and shortness of breath.   Cardiovascular: Negative for chest pain.  Gastrointestinal: Negative for vomiting and abdominal pain.  Musculoskeletal: Negative for back pain.  Skin: Positive for rash.  Neurological: Negative for syncope, numbness and headaches.  Psychiatric/Behavioral:       No behavior change.    Allergies  Review of patient's allergies indicates no known allergies.  Home Medications   Current Outpatient Rx  Name Route Sig Dispense Refill  . SUMATRIPTAN  SUCCINATE 100 MG PO TABS Oral Take 100 mg by mouth every 2 (two) hours as needed.    Marland Kitchen FAMCICLOVIR 500 MG PO TABS Oral Take 1 tablet (500 mg total) by mouth 3 (three) times daily. X 7 days 21 tablet 0  . OXYCODONE-ACETAMINOPHEN 5-325 MG PO TABS Oral Take 2 tablets by mouth every 6 (six) hours as needed for pain. 20 tablet 0  . VALACYCLOVIR HCL 1 G PO TABS  TAKE 1 TABLET BY MOUTH EVERY DAY. NEEDS OFFICE VISIT TO DISCUSS MEDICATION. 30 tablet 1    n[facility] OFFICE VISIT IN jULY 2013    BP 139/79  Pulse 73  Temp(Src) 98.5 F (36.9 C) (Oral)  Resp 16  Ht 5\' 6"  (1.676 m)  Wt 200 lb (90.719 kg)  BMI 32.28 kg/m2  SpO2 100%  LMP 06/30/2011  Physical Exam  Nursing note and vitals reviewed. Constitutional:       Awake, alert, nontoxic appearance.  HENT:  Head: Atraumatic.  Mouth/Throat: Oropharynx is clear and moist.  Eyes: Conjunctivae are normal. Pupils are equal, round, and reactive to light. Right eye exhibits no discharge. Left eye exhibits no discharge. No scleral icterus.  Neck: Neck supple.  Cardiovascular: Normal rate and regular rhythm.   No murmur heard. Pulmonary/Chest: Breath sounds normal. No respiratory distress. She has no wheezes. She has no rales. She exhibits  no tenderness.  Abdominal: Soft. There is no tenderness. There is no rebound.  Musculoskeletal: She exhibits no tenderness.       Baseline ROM, no obvious new focal weakness.  Neurological:       Mental status and motor strength appears baseline for patient and situation.  Skin: Rash noted.       No rash noted except for the left lower facial area with a classic zoster-like rash with scattered clusters of vesicles on a minimally erythematous nontender base without evidence of secondary cellulitis and no involvement of her nose or periorbital area. This is consistent with localized zoster.  Psychiatric: She has a normal mood and affect.    ED Course  Procedures (including critical care time)  Labs Reviewed - No  data to display No results found.   1. Herpes zoster infection       MDM   Pt stable in ED with no significant deterioration in condition.Patient / Family / Caregiver informed of clinical course, understand medical decision-making process, and agree with plan.I doubt any other EMC precluding discharge at this time including, but not necessarily limited to the following:ocular involvement, SBI.       Hurman Horn, MD 08/11/11 [phone]

## 2011-08-07 ENCOUNTER — Other Ambulatory Visit: Payer: Self-pay | Admitting: Family Medicine

## 2011-08-16 ENCOUNTER — Emergency Department (HOSPITAL_BASED_OUTPATIENT_CLINIC_OR_DEPARTMENT_OTHER)
Admission: EM | Admit: 2011-08-16 | Discharge: 2011-08-17 | Disposition: A | Discharge status: Home / Self Care | Payer: 59 | Attending: Emergency Medicine | Admitting: Emergency Medicine

## 2011-08-16 ENCOUNTER — Encounter (HOSPITAL_BASED_OUTPATIENT_CLINIC_OR_DEPARTMENT_OTHER): Payer: Self-pay | Admitting: *Deleted

## 2011-08-16 DIAGNOSIS — R21 Rash and other nonspecific skin eruption: Secondary | ICD-10-CM | POA: Insufficient documentation

## 2011-08-16 MED ORDER — PREDNISONE 50 MG PO TABS: ORAL_TABLET | ORAL | Status: AC

## 2011-08-16 MED ORDER — FAMOTIDINE 20 MG PO TABS
20.0000 mg | ORAL_TABLET | Freq: Once | ORAL | Status: AC
  Administered 2011-08-17: 20 mg via ORAL
  Filled 2011-08-16: qty 1

## 2011-08-16 MED ORDER — PREDNISONE 50 MG PO TABS
60.0000 mg | ORAL_TABLET | Freq: Once | ORAL | Status: AC
  Administered 2011-08-17: 60 mg via ORAL
  Filled 2011-08-16: qty 1

## 2011-08-16 NOTE — ED Provider Notes (Signed)
History     CSN: [id number]  Arrival date & time 08/16/11  2107   First MD Initiated Contact with Patient 08/16/11 2319      Chief Complaint  Patient presents with  . Rash    (Consider location/radiation/quality/duration/timing/severity/associated sxs/prior treatment) HPI Comments: No identified allergen exposure.  No diff breathing or swallowing.  Patient is a 41 y.o. female presenting with rash. The history is provided by the patient. No language interpreter was used.  Rash  This is a new problem. Episode onset: ~ 1 week ago. The problem has not changed since onset.The problem is associated with nothing. There has been no fever. The rash is present on the neck (upper chest.  scattered lesions on forearms and on R posterior flank.). The patient is experiencing no pain. She has tried anti-itch cream for the symptoms. The treatment provided no relief.    Past Medical History  Diagnosis Date  . Herpes genitalis in women   . Migraine     Past Surgical History  Procedure Date  . Cesarean section   . Tubal ligation     No family history on file.  History  Substance Use Topics  . Smoking status: Never Smoker   . Smokeless tobacco: Never Used  . Alcohol Use: 0.6 oz/week    1 Glasses of wine per week    OB History    Grav Para Term Preterm Abortions TAB SAB Ect Mult Living                  Review of Systems  Constitutional: Negative for fever.  HENT: Negative for sore throat.   Respiratory: Negative for shortness of breath, wheezing and stridor.   Skin: Positive for rash.  All other systems reviewed and are negative.    Allergies  Review of patient's allergies indicates no known allergies.  Home Medications   Current Outpatient Rx  Name Route Sig Dispense Refill  . PREDNISONE 50 MG PO TABS  One tab po QD 6 tablet 0  . SUMATRIPTAN SUCCINATE 100 MG PO TABS Oral Take 100 mg by mouth every 2 (two) hours as needed.    Marland Kitchen VALACYCLOVIR HCL 1 G PO TABS  TAKE 1 TABLET  BY MOUTH EVERY DAY. NEEDS OFFICE VISIT TO DISCUSS MEDICATION. 30 tablet 1    n[facility] OFFICE VISIT IN jULY 2013    BP 123/76  Pulse 70  Temp(Src) 98.5 F (36.9 C) (Oral)  Resp 20  Ht 5\' 6"  (1.676 m)  Wt 200 lb (90.719 kg)  BMI 32.28 kg/m2  SpO2 100%  LMP 06/30/2011  Physical Exam  Nursing note and vitals reviewed. Constitutional: She is oriented to person, place, and time. She appears well-developed and well-nourished. No distress.  HENT:  Head: Normocephalic and atraumatic.  Eyes: EOM are normal.  Neck: Normal range of motion.  Cardiovascular: Normal rate, regular rhythm and normal heart sounds.   Pulmonary/Chest: Effort normal and breath sounds normal. No respiratory distress. She has no wheezes.  Abdominal: Soft. She exhibits no distension. There is no tenderness.  Musculoskeletal: Normal range of motion.  Neurological: She is alert and oriented to person, place, and time.  Skin: Skin is warm and dry. Rash noted. No petechiae and no purpura noted. Rash is maculopapular. Rash is not nodular, not vesicular and not urticarial. There is erythema.       Neck and upper chest seem to be most affected area but also note few scattered lesions on forearms and R posterior flank.  +  erythema.  Non-urticarial lesions.  + pruritic. Inflamed.  Psychiatric: She has a normal mood and affect. Judgment normal.    ED Course  Procedures (including critical care time)  Labs Reviewed - No data to display No results found.   1. Rash       MDM  rx pred 50 mg, QD, 6 Benadryl 50 mg QID. pepcid 20 mg BID.  Return if any worsening sxs.        Worthy Rancher, PA 08/16/11 [phone]

## 2011-08-16 NOTE — ED Provider Notes (Signed)
Medical screening examination/treatment/procedure(s) were performed by non-physician practitioner and as supervising physician I was immediately available for consultation/collaboration.   Gavin Pound. Michall Noffke, MD 08/16/11 2356

## 2011-08-16 NOTE — Discharge Instructions (Signed)
Allergic Reaction, Mild to Moderate Allergies may happen from anything your body is sensitive to. This may be food, medications, pollens, chemicals, and nearly anything around you in everyday life that produces allergens. An allergen is anything that causes an allergy producing substance. Allergens cause your body to release allergic antibodies. Through a chain of events, they cause a release of histamine into the blood stream. Histamines are meant to protect you, but they also cause your discomfort. This is why antihistamines are often used for allergies. Heredity is often a factor in causing allergic reactions. This means you may have some of the same allergies as your parents. Allergies happen in all age groups. You may have some idea of what caused your reaction. There are many allergens around us. It may be difficult to know what caused your reaction. If this is a first time event, it may never happen again. Allergies cannot be cured but can be controlled with medications. SYMPTOMS  You may get some or all of the following problems from allergies.  Swelling and itching in and around the mouth.   Tearing, itchy eyes.   Nasal congestion and runny nose.   Sneezing and coughing.   An itchy red rash or hives.   Vomiting or diarrhea.   Difficulty breathing.  Seasonal allergies occur in all age groups. They are seasonal because they usually occur during the same season every year. They may be a reaction to molds, grass pollens, or tree pollens. Other causes of allergies are house dust mite allergens, pet dander and mold spores. These are just a common few of the thousands of allergens around us. All of the symptoms listed above happen when you come in contact with pollens and other allergens. Seasonal allergies are usually not life threatening. They are generally more of a nuisance that can often be handled using medications. Hay fever is a combination of all or some of the above listed allergy  problems. It may often be treated with simple over-the-counter medications such as diphenhydramine. Take medication as directed. Check with your caregiver or package insert for child dosages. TREATMENT AND HOME CARE INSTRUCTIONS If hives or rash are present:  Take medications as directed.   You may use an over-the-counter antihistamine (diphenhydramine) for hives and itching as needed. Do not drive or drink alcohol until medications used to treat the reaction have worn off. Antihistamines tend to make people sleepy.   Apply cold cloths (compresses) to the skin or take baths in cool water. This will help itching. Avoid hot baths or showers. Heat will make a rash and itching worse.   If your allergies persist and become more severe, and over the counter medications are not effective, there are many new medications your caretaker can prescribe. Immunotherapy or desensitizing injections can be used if all else fails. Follow up with your caregiver if problems continue.  SEEK MEDICAL CARE IF:   Your allergies are becoming progressively more troublesome.   You suspect a food allergy. Symptoms generally happen within 30 minutes of eating a food.   Your symptoms have not gone away within 2 days or are getting worse.   You develop new symptoms.   You want to retest yourself or your child with a food or drink you think causes an allergic reaction. Never test yourself or your child of a suspected allergy without being under the watchful eye of your caregivers. A second exposure to an allergen may be life-threatening.  SEEK IMMEDIATE MEDICAL CARE IF:  You   develop difficulty breathing or wheezing, or have a tight feeling in your chest or throat.   You develop a swollen mouth, hives, swelling, or itching all over your body.  A severe reaction with any of the above problems should be considered life-threatening. If you suddenly develop difficulty breathing call for local emergency medical help. THIS IS AN  EMERGENCY. MAKE SURE YOU:   Understand these instructions.   Will watch your condition.   Will get help right away if you are not doing well or get worse.  Document Released: 12/29/2006 Document Revised: 02/20/2011 Document Reviewed: 12/29/2006 [facility] Patient Information 2012 [place], [place].Rash A rash is a change in the color or texture of your skin. There are many different types of rashes. You may have other problems that accompany your rash. CAUSES   Infections.   Allergic reactions. This can include allergies to pets or foods.   Certain medicines.   Exposure to certain chemicals, soaps, or cosmetics.   Heat.   Exposure to poisonous plants.   Tumors, both cancerous and noncancerous.  SYMPTOMS   Redness.   Scaly skin.   Itchy skin.   Dry or cracked skin.   Bumps.   Blisters.   Pain.  DIAGNOSIS  Your caregiver may do a physical exam to determine what type of rash you have. A skin sample (biopsy) may be taken and examined under a microscope. TREATMENT  Treatment depends on the type of rash you have. Your caregiver may prescribe certain medicines. For serious conditions, you may need to see a skin doctor (dermatologist). HOME CARE INSTRUCTIONS   Avoid the substance that caused your rash.   Do not scratch your rash. This can cause infection.   You may take cool baths to help stop itching.   Only take over-the-counter or prescription medicines as directed by your caregiver.   Keep all follow-up appointments as directed by your caregiver.  SEEK IMMEDIATE MEDICAL CARE IF:  You have increasing pain, swelling, or redness.   You have a fever.   You have new or severe symptoms.   You have body aches, diarrhea, or vomiting.   Your rash is not better after 3 days.  MAKE SURE YOU:  Understand these instructions.   Will watch your condition.   Will get help right away if you are not doing well or get worse.  Document Released: 02/21/2002 Document  Revised: 02/20/2011 Document Reviewed: 12/16/2010 [facility] Patient Information 2012 [place], [place].   Take the prednisone as directed.  Take benadryl 50 mg every 6 hrs and pepcid 20 mg every 12 hrs.  Return to the ED if your symptoms worsen or change significantly.

## 2011-08-16 NOTE — ED Notes (Signed)
Rash x 2 weeks- no relief with OTC meds

## 2011-11-18 ENCOUNTER — Other Ambulatory Visit (HOSPITAL_COMMUNITY)
Admission: RE | Admit: 2011-11-18 | Discharge: 2011-11-18 | Disposition: A | Discharge status: Home / Self Care | Payer: 59 | Source: Ambulatory Visit | Attending: Family Medicine | Admitting: Family Medicine

## 2011-11-18 ENCOUNTER — Other Ambulatory Visit: Payer: Self-pay | Admitting: Family Medicine

## 2011-11-18 DIAGNOSIS — Z124 Encounter for screening for malignant neoplasm of cervix: Secondary | ICD-10-CM | POA: Insufficient documentation

## 2015-01-06 ENCOUNTER — Encounter (HOSPITAL_BASED_OUTPATIENT_CLINIC_OR_DEPARTMENT_OTHER): Payer: Self-pay | Admitting: *Deleted

## 2015-01-06 ENCOUNTER — Emergency Department (HOSPITAL_BASED_OUTPATIENT_CLINIC_OR_DEPARTMENT_OTHER): Payer: 59

## 2015-01-06 ENCOUNTER — Emergency Department (HOSPITAL_BASED_OUTPATIENT_CLINIC_OR_DEPARTMENT_OTHER)
Admission: EM | Admit: 2015-01-06 | Discharge: 2015-01-06 | Disposition: A | Discharge status: Home / Self Care | Payer: 59 | Attending: Emergency Medicine | Admitting: Emergency Medicine

## 2015-01-06 DIAGNOSIS — S8392XA Sprain of unspecified site of left knee, initial encounter: Secondary | ICD-10-CM | POA: Diagnosis not present

## 2015-01-06 DIAGNOSIS — Z8619 Personal history of other infectious and parasitic diseases: Secondary | ICD-10-CM | POA: Insufficient documentation

## 2015-01-06 DIAGNOSIS — Y9289 Other specified places as the place of occurrence of the external cause: Secondary | ICD-10-CM | POA: Diagnosis not present

## 2015-01-06 DIAGNOSIS — W1789XA Other fall from one level to another, initial encounter: Secondary | ICD-10-CM | POA: Insufficient documentation

## 2015-01-06 DIAGNOSIS — Y9339 Activity, other involving climbing, rappelling and jumping off: Secondary | ICD-10-CM | POA: Insufficient documentation

## 2015-01-06 DIAGNOSIS — Y998 Other external cause status: Secondary | ICD-10-CM | POA: Diagnosis not present

## 2015-01-06 DIAGNOSIS — G43909 Migraine, unspecified, not intractable, without status migrainosus: Secondary | ICD-10-CM | POA: Insufficient documentation

## 2015-01-06 DIAGNOSIS — R52 Pain, unspecified: Secondary | ICD-10-CM

## 2015-01-06 DIAGNOSIS — S82142A Displaced bicondylar fracture of left tibia, initial encounter for closed fracture: Secondary | ICD-10-CM

## 2015-01-06 DIAGNOSIS — T148XXA Other injury of unspecified body region, initial encounter: Secondary | ICD-10-CM

## 2015-01-06 DIAGNOSIS — S8992XA Unspecified injury of left lower leg, initial encounter: Secondary | ICD-10-CM | POA: Diagnosis present

## 2015-01-06 DIAGNOSIS — S82192A Other fracture of upper end of left tibia, initial encounter for closed fracture: Secondary | ICD-10-CM | POA: Insufficient documentation

## 2015-01-06 MED ORDER — IOHEXOL 350 MG/ML SOLN
100.0000 mL | Freq: Once | INTRAVENOUS | Status: AC | PRN
Start: 2015-01-06 — End: 2015-01-06
  Administered 2015-01-06: 100 mL via INTRAVENOUS

## 2015-01-06 MED ORDER — OXYCODONE-ACETAMINOPHEN 5-325 MG PO TABS
2.0000 | ORAL_TABLET | Freq: Once | ORAL | Status: AC
  Administered 2015-01-06: 2 via ORAL
  Filled 2015-01-06: qty 2

## 2015-01-06 MED ORDER — OXYCODONE-ACETAMINOPHEN 5-325 MG PO TABS: 2.0000 | ORAL_TABLET | ORAL | Status: DC | PRN

## 2015-01-06 NOTE — ED Notes (Signed)
Presents with left knee pain, pt states jumped off a retaining wall, approx 30 min, had acute onset of rt knee pain, landed feet

## 2015-01-06 NOTE — ED Notes (Signed)
Pt has tenderness on medial and lateral aspect of patella area, tender to touch, no obvious swelling noted at this time

## 2015-01-06 NOTE — ED Provider Notes (Signed)
CSN: [id number]     Arrival date & time 01/06/15  1104 History   First MD Initiated Contact with Patient 01/06/15 1210     Chief Complaint  Patient presents with  . Knee Pain     (Consider location/radiation/quality/duration/timing/severity/associated sxs/prior Treatment) HPI   Loyce Flaming is a 44 y.o F with no significant past medical history presents to the emergency department today complaining of left knee pain. Patient states that she jumped off a 4 foot brick wall when she landed her knee gave out from underneath her. Since friend who witnessed the fall states that her knee turned inward and they heard a large pop. Patient is now unable to bear weight on her left knee at all. Pain is 10 out of 10. No other trauma or injury.  Past Medical History  Diagnosis Date  . Herpes genitalis in women   . Migraine    Past Surgical History  Procedure Laterality Date  . Cesarean section    . Tubal ligation     History reviewed. No pertinent family history. Social History  Substance Use Topics  . Smoking status: Never Smoker   . Smokeless tobacco: Never Used  . Alcohol Use: 0.6 oz/week    1 Glasses of wine per week   OB History    No data available     Review of Systems  All other systems reviewed and are negative.     Allergies  Review of patient's allergies indicates no known allergies.  Home Medications   Prior to Admission medications   Medication Sig Start Date End Date Taking? Authorizing Provider  SUMAtriptan (IMITREX) 100 MG tablet Take 100 mg by mouth every 2 (two) hours as needed.    Historical Provider, MD  valACYclovir (VALTREX) 1000 MG tablet TAKE 1 TABLET BY MOUTH EVERY DAY. NEEDS OFFICE VISIT TO DISCUSS MEDICATION. 08/07/11   Argentina Donovan, PA-C   BP 129/86 mmHg  Pulse 87  Temp(Src) 98.3 F (36.8 C) (Oral)  Resp 18  Ht 5\' 6"  (1.676 m)  Wt 240 lb (108.863 kg)  BMI 38.76 kg/m2  SpO2 99%  LMP 12/30/2014 (Approximate) Physical Exam   Constitutional: She is oriented to person, place, and time. She appears well-developed and well-nourished. No distress.  HENT:  Head: Normocephalic and atraumatic.  Mouth/Throat: Oropharynx is clear and moist. No oropharyngeal exudate.  Eyes: Conjunctivae and EOM are normal. Pupils are equal, round, and reactive to light. Right eye exhibits no discharge. Left eye exhibits no discharge. No scleral icterus.  Neck: Normal range of motion.  Cardiovascular: Normal rate, regular rhythm, normal heart sounds and intact distal pulses.  Exam reveals no gallop and no friction rub.   No murmur heard. Pulmonary/Chest: Effort normal and breath sounds normal. No respiratory distress. She has no wheezes. She has no rales. She exhibits no tenderness.  Abdominal: Soft. She exhibits no distension and no mass. There is no tenderness. There is no rebound and no guarding.  Musculoskeletal: Normal range of motion. She exhibits no edema.  Negative anterior/poster drawer bilaterally. Positive ballottement test. Extreme valgus laxity. Moderate varus laxity present. No crepitus. Pain felt with flexion. Pt most comfortable with knee extended. No TTP of hip or ankles.  Intact distal pulses. Good cap refill.    Lymphadenopathy:    She has no cervical adenopathy.  Neurological: She is alert and oriented to person, place, and time. No cranial nerve deficit.  Skin: Skin is warm and dry. No rash noted. She is not diaphoretic. No  erythema. No pallor.  Psychiatric: She has a normal mood and affect. Her behavior is normal.  Nursing note and vitals reviewed.   ED Course  Procedures (including critical care time) Labs Review Labs Reviewed - No data to display  Imaging Review Ct Angio Low Extrem Left W/cm &/or Wo/cm  01/06/2015  CLINICAL DATA:  Left knee pain/swelling, possible knee dislocation, evaluate knee vasculature EXAM: CT ANGIOGRAPHY LOWER LEFT EXTREMITY CONTRAST:  131mL OMNIPAQUE IOHEXOL 350 MG/ML SOLN COMPARISON:   Left knee radiographs dated 01/06/2015 FINDINGS: Patent 3-vessel runoff to the ankle. No evidence of traumatic pseudoaneurysm. Suspected nondepressed posterior lateral tibial plateau fracture (series 6/image 34; series 8/image 91). Associated moderate suprapatellar knee joint effusion with lipohemarthrosis (series 9/ image 152). No evidence of fluid collection or intramuscular hematoma. Review of the MIP images confirms the above findings. IMPRESSION: Patent 3 vessel runoff to the ankle. No evidence traumatic pseudoaneurysm. Suspected nondepressed posterolateral tibial plateau fracture. Associated moderate suprapatellar knee joint effusion with lipohemarthrosis. Electronically Signed   By: Julian Hy M.D.   On: 01/06/2015 16:24   Dg Knee Complete 4 Views Left  01/06/2015  CLINICAL DATA:  Jumped from a retaining wall at a football game today injuring LEFT knee, immediate pain at sites of joint running down lower leg, knee buckles under pressure, initial encounter EXAM: LEFT KNEE - COMPLETE 4+ VIEW COMPARISON:  None FINDINGS: Osseous mineralization normal. Joint spaces preserved. Small knee joint effusion. No acute fracture, dislocation or bone destruction. IMPRESSION: Small knee joint effusion without acute fracture dislocation. Electronically Signed   By: Lavonia Dana M.D.   On: 01/06/2015 12:02   I have personally reviewed and evaluated these images and lab results as part of my medical decision-making.   EKG Interpretation None      MDM   Final diagnoses:  Ligament tear  Tibial plateau fracture, left, closed, initial encounter   patient presents with sudden onset knee pain after jumping from a 4 foot tall [place] wall. Patient unable to bear weight on left knee. Extreme valgus laxity noted on exam. Positive ballottement test. Suspect ligamentous injury. MRI ordered however, due to time constraint will defer this to an outpatient imaging study. Although, pt has intact distal pulses there is  concern for dislocation of knee with potential to disturb popliteal vasculature. Will order CT angio of knee.   CT knee reveals nondepressed posterolateral tibial plateau fracture. No traumatic pseudoaneurysm or vasculature injury. There is a moderate suprapatellar effusion present.   Dr. Barbaraann Barthel with orthopedic surgery was consulted. He recommends placing patient in a knee immobilizer as well as applying a Dave dressing. Patient is to keep knee straight and elevated. He will see patient and his office on Monday. Discussed treatment plan with patient who is agreeable.  Will discharge home with pain medication. Return precautions outlined in patient discharge instructions.    Pearlina Friedly [place], PA-C 01/07/15 0013  Serita Grit, MD 01/07/15 [phone]

## 2015-01-06 NOTE — Discharge Instructions (Signed)
Cast or Splint Care °Casts and splints support injured limbs and keep bones from moving while they heal. It is important to care for your cast or splint at home.   °HOME CARE INSTRUCTIONS °· Keep the cast or splint uncovered during the drying period. It can take 24 to 48 hours to dry if it is made of plaster. A fiberglass cast will dry in less than 1 hour. °· Do not rest the cast on anything harder than a pillow for the first 24 hours. °· Do not put weight on your injured limb or apply pressure to the cast until your health care provider gives you permission. °· Keep the cast or splint dry. Wet casts or splints can lose their shape and may not support the limb as well. A wet cast that has lost its shape can also create harmful pressure on your skin when it dries. Also, wet skin can become infected. °· Cover the cast or splint with a plastic bag when bathing or when out in the rain or snow. If the cast is on the trunk of the body, take sponge baths until the cast is removed. °· If your cast does become wet, dry it with a towel or a blow dryer on the cool setting only. °· Keep your cast or splint clean. Soiled casts may be wiped with a moistened cloth. °· Do not place any hard or soft foreign objects under your cast or splint, such as cotton, toilet paper, lotion, or powder. °· Do not try to scratch the skin under the cast with any object. The object could get stuck inside the cast. Also, scratching could lead to an infection. If itching is a problem, use a blow dryer on a cool setting to relieve discomfort. °· Do not trim or cut your cast or remove padding from inside of it. °· Exercise all joints next to the injury that are not immobilized by the cast or splint. For example, if you have a long leg cast, exercise the hip joint and toes. If you have an arm cast or splint, exercise the shoulder, elbow, thumb, and fingers. °· Elevate your injured arm or leg on 1 or 2 pillows for the first 1 to 3 days to decrease  swelling and pain. It is best if you can comfortably elevate your cast so it is higher than your heart. °SEEK MEDICAL CARE IF:  °· Your cast or splint cracks. °· Your cast or splint is too tight or too loose. °· You have unbearable itching inside the cast. °· Your cast becomes wet or develops a soft spot or area. °· You have a bad smell coming from inside your cast. °· You get an object stuck under your cast. °· Your skin around the cast becomes red or raw. °· You have new pain or worsening pain after the cast has been applied. °SEEK IMMEDIATE MEDICAL CARE IF:  °· You have fluid leaking through the cast. °· You are unable to move your fingers or toes. °· You have discolored (blue or white), cool, painful, or very swollen fingers or toes beyond the cast. °· You have tingling or numbness around the injured area. °· You have severe pain or pressure under the cast. °· You have any difficulty with your breathing or have shortness of breath. °· You have chest pain. °  °This information is not intended to replace advice given to you by your health care provider. Make sure you discuss any questions you have with your health care   provider.   Document Released: 02/29/2000 Document Revised: 12/22/2012 Document Reviewed: 09/09/2012 Elsevier Interactive Patient Education 2016 [employer]  Knee Fracture, Adult A knee fracture is a break in a bone of the knee. The break may be in the kneecap (patella), the lower part of the thigh bone (femur), or the upper part of the shin bone (tibia). There are several types of fractures. They include:  Stable. In this type of fracture, the bones of the knee remain in place after the break.  Displaced. In this type of fracture, the bones no longer line up after the break.  Comminuted. In this type of fracture, the bone breaks into several pieces.  Open. In this type of fracture, the broken bone comes through the skin. CAUSES This injury is usually caused by a fall. This  injury can happen because of the impact of the fall or from a violent contraction of the leg muscles before you hit the ground. It can also result from a car accident or a collision with a hard surface. RISK FACTORS This injury is more likely to develop in people who:  Are female.  Are 66-35 years old.  Participate in high-energy sports.  Have a condition that weakens the bones, such as osteoporosis.  Have had a knee replacement. SYMPTOMS Symptoms of this injury include:  Pain.  Swelling.  Bruising.  Inability to bend your knee.  Misshapen knee.  Inability to walk.  Inability to use your injured leg to support your body weight. DIAGNOSIS This injury is diagnosed with a physical exam. Your health care provider may also order:  Imaging studies, such as an X-ray, CT scan, MRI scan, or ultrasound.  A procedure called arthroscopy to view the inside of your knee with a small camera. TREATMENT Treatment for this injury may involve:  Wearing a splint until swelling goes down.  Wearing a cast to keep the fractured bone from moving while it heals. A cast is usually put on after swelling has gone down.  Surgery to move a bone back into place. HOME CARE INSTRUCTIONS If You Have a Splint:  Wear it as directed by your health care provider. Remove it only as directed by your health care provider.  Loosen the splint if your toes become numb and tingle, or if they turn cold and blue.  Do not put pressure on any part of your splint. If You Have a Cast:  Do not stick anything inside the cast to scratch your skin. Doing that increases your risk of infection.  Check the skin around the cast every day. Report any concerns to your health care provider. You may put lotion on dry skin around the edges of the cast. Do not apply lotion to the skin underneath the cast. Bathing  Cover the cast or splint with a watertight plastic bag to protect it from water while you take a bath or a  shower. Do not let the cast or splint get wet. Managing Pain, Stiffness, and Swelling  If directed, apply ice to the injured area:  Put ice in a plastic bag.  Place a towel between your skin and the bag.  Leave the ice on for 20 minutes, 2-3 times a day.  Move your toes often to avoid stiffness and to lessen swelling.  Raise the injured area above the level of your heart while you are lying down. Driving  Do not drive or operate heavy machinery while taking pain medicine.  Do not drive while wearing a cast  or splint on a hand or foot that you use for driving. Activity  Return to your normal activities as directed by your health care provider. Ask your health care provider what activities are safe for you. General Instructions  Do not put pressure on any part of the cast or splint until it is fully hardened. This may take several hours.  Keep the cast or splint clean and dry.  Do not use any tobacco products, including cigarettes, chewing tobacco, or electronic cigarettes. Tobacco can delay bone healing. If you need help quitting, ask your health care provider.  Take medicines only as directed by your health care provider.  Keep all follow-up visits as directed by your health care provider. This is important. SEEK MEDICAL CARE IF:  You have knee pain and swelling.  You have trouble walking.  Your cast becomes wet or damaged or suddenly feels too tight. SEEK IMMEDIATE MEDICAL CARE IF:  Your pain and swelling get worse.  You have severe pain below the fracture.  Your skin or toenails turn blue or gray, feel cold, or become numb.  You have fluid, blood, or pus coming from under your cast.   This information is not intended to replace advice given to you by your health care provider. Make sure you discuss any questions you have with your health care provider.   Document Released: 01/14/2006 Document Revised: 03/24/2014 Document Reviewed: 11/02/2013 Elsevier Interactive  Patient Education 2016 [employer].  How to Use a Knee Brace A knee brace is a device that you wear to support your knee, especially if the knee is healing after an injury or surgery. There are several types of knee braces. Some are designed to prevent an injury (prophylactic brace). These are often worn during sports. Others support an injured knee (functional brace) or keep it still while it heals (rehabilitative brace). People with severe arthritis of the knee may benefit from a brace that takes some pressure off the knee (unloader brace). Most knee braces are made from a combination of cloth and metal or plastic.  You may need to wear a knee brace to:  Relieve knee pain.  Help your knee support your weight (improve stability).  Help you walk farther (improve mobility).  Prevent injury.  Support your knee while it heals from surgery or from an injury. RISKS AND COMPLICATIONS Generally, knee braces are very safe to wear. However, problems may occur, including:  Skin irritation that may lead to infection.  Making your condition worse if you wear the brace in the wrong way. HOW TO USE A KNEE BRACE Different braces will have different instructions for use. Your health care provider will tell you or show you:  How to put on your brace.  How to adjust the brace.  When and how often to wear the brace.  How to remove the brace.  If you will need any assistive devices in addition to the brace, such as crutches or a cane. In general, your brace should:  Have the hinge of the brace line up with the bend of your knee.  Have straps, hooks, or tapes that fasten snugly around your leg.  Not feel too tight or too loose. HOW TO CARE FOR A KNEE BRACE  Check your brace often for signs of damage, such as loose connections or attachments. Your knee brace may get damaged or wear out during normal use.  Wash the fabric parts of your brace with soap and water.  Read the insert that  comes  with your brace for other specific care instructions. SEEK MEDICAL CARE IF:  Your knee brace is too loose or too tight and you cannot adjust it.  Your knee brace causes skin redness, swelling, bruising, or irritation.  Your knee brace is not helping.  Your knee brace is making your knee pain worse.   This information is not intended to replace advice given to you by your health care provider. Make sure you discuss any questions you have with your health care provider.   Document Released: 05/24/2003 Document Revised: 11/22/2014 Document Reviewed: 06/26/2014 Elsevier Interactive Patient Education 2016 [place] Fracture With Rehab The tibial plateau is the top surface of the shinbone (tibia) and is part of the knee joint. A tibial plateau fracture is a break that occurs in this portion of the shinbone. Tibial plateau fractures are fairly common. This injury is also known as a "bumper injury," because it often occurs when a person is hit by the bumper of a car.  SYMPTOMS   Severe pain at the fracture site, at the time of injury, which may continue over a period of time.  Tenderness, inflammation, and/or bruising (contusion) over the fracture site.  Decreased knee function.  Inability to stand or walk on the injured leg.  Visible deformity, if the bone fragments are not properly aligned (displaced fracture).  Signs of vascular damage: numbness and coldness below the injury site. CAUSES  Tibial plateau fractures occur when a force is placed on the bone that is greater than it can withstand. Common causes of injury include:  Direct hit (trauma) to the tibial plateau.  Indirect trauma, such as a twisting or bending injury. RISK INCREASES WITH:  Contact sports (football, rugby, lacrosse).  Motor sports.  Bone disease (osteoporosis, bone tumor).  Metabolism disorders, hormone problems, nutritional deficiency, or eating disorders (anorexia,  bulimia).  Poor strength and flexibility. PREVENTION  Warm up and stretch properly before activity.  Maintain physical fitness:  Strength, flexibility, and endurance.  Cardiovascular fitness.  Wear properly fitted and padded protective equipment (i.e. shin guards for soccer). PROGNOSIS  If treated properly, tibial plateau fractures usually heal.  RELATED COMPLICATIONS   Fracture fails to heal (nonunion).  Fracture heals in a poor position (malunion).  Bleeding within the leg that leads to the squeezing of nerves inside a closed space (compartment syndrome), causing injury to the nerves or vessels in the leg.  Shortening of the injured bones.  Shinbone growth stopping in children.  Infection, if the skin is broken over the fracture site (open fracture).  Arthritis of the knee.  Longer healing time, if not properly treated or re-injured.  Stiff knee.  Risks of surgery: infection, bleeding, nerve damage, or damage to surrounding tissues. TREATMENT Treatment first involves the use of ice and medicine to reduce pain and inflammation. If the bone fragments are out of alignment (displaced fracture), immediate realignment of the bone (reduction) by a trained professional is required. Fractures that cannot be realigned by hand, or are open (bones poking through the skin), may require surgery to hold the fracture in place with screws, pins, and plates. Once the bone is aligned, the knee should be restrained to allow for healing. After restraint, it is important to perform strengthening and stretching exercises to help regain strength and a full range of motion. These exercises may be completed at home or with a therapist.  MEDICATION   If pain medicine is needed, nonsteroidal anti-inflammatory medicines (aspirin  and ibuprofen), or other minor pain relievers (acetaminophen), are often recommended.  Do not take pain medicine for 7 days before surgery.  Prescription pain relievers may  be given if your caregiver thinks they are needed. Use only as directed and only as much as you need. COLD THERAPY  Cold treatment (icing) relieves pain and reduces inflammation. Cold treatment should be applied for 10 to 15 minutes every 2 to 3 hours, and immediately after activity that aggravates your symptoms. Use ice packs or an ice massage. SEEK MEDICAL CARE IF:  Treatment does not seem to help, or the condition gets worse.  Any medicines produce negative side effects.  Any complications from surgery occur:  Pain, numbness, or coldness in the affected leg.  Discoloration under the toenails (blue or gray) of the affected leg.  Signs of infection (fever, pain, inflammation, redness, or persistent bleeding). EXERCISES RANGE OF MOTION (ROM) AND STRETCHING EXERCISES - Tibial Plateau Fracture These exercises may help you when beginning to rehabilitate your injury. Your symptoms may go away with or without further involvement from your physician, physical therapist or athletic trainer. While completing these exercises, remember:   Restoring tissue flexibility helps normal motion to return to the joints. This allows healthier, less painful movement and activity.  An effective stretch should be held for at least 30 seconds.  A stretch should never be painful. You should only feel a gentle lengthening or release in the stretched tissue. RANGE OF MOTION - Knee Flexion and Extension, Active-Assisted  Sit on the edge of a table or chair with your thighs firmly supported. It may help to place a folded towel under the end of your right / left thigh.  Flexion (bending): Place the ankle of your healthy leg on top of the other ankle. Use your healthy leg to gently bend your right / left knee until you feel a mild tension across the top of your knee.  Hold for __________ seconds.  Extension (straightening): Switch your ankles so your right / left leg is on top. Use your healthy leg to straighten  your right / left knee until you feel a mild tension on the backside of your knee.  Hold for __________ seconds. Repeat __________ times. Complete __________ times per day. RANGE OF MOTION - Knee Flexion, Active  Lie on your back with both knees straight. (If this causes back discomfort, bend the knee of your healthy leg, placing your foot flat on the floor.)  Slowly slide your right / left heel back toward your buttocks until you feel a gentle stretch in the front of your knee or thigh.  Hold for __________ seconds. Slowly slide your heel back to the starting position. Repeat __________ times. Complete this exercise __________ times per day.  STRETCH - Knee Flexion, Supine  Lie on the floor with your right / left heel and foot lightly touching the wall (place both feet on the wall if you do not use a door frame).  Without using any effort, allow gravity to slide your foot down the wall slowly, until you feel a gentle stretch in the front of your right / left knee.  Hold this stretch for __________ seconds. Then, return the leg to the starting position, using your healthy leg for help, if needed. Repeat __________ times. Complete this stretch __________ times per day.  STRETCH - Hamstrings, Supine   Lie on your back. Loop a belt or towel over the ball of your right / left foot.  Straighten your  right / left knee and slowly pull on the belt to raise your leg. Do not allow the right / left knee to bend. Keep your opposite leg flat on the floor.  Raise the leg until you feel a gentle stretch behind your right / left knee or thigh. Hold this position for __________ seconds. Repeat __________ times. Complete this stretch __________ times per day.  STRETCH - Hamstrings, Doorway  Lie on your back with your right / left leg extended and resting on the wall, and your opposite leg flat on the ground, through the door. To start, position your bottom farther away from the wall than the picture  shows.  Keep your right / left knee straight. If you feel a stretch behind your knee or thigh, hold this position for __________ seconds.  If you do not feel a stretch, scoot your bottom closer to the door, and hold for __________ seconds. Repeat __________ times. Complete this stretch __________ times per day.  STRETCH - Knee Extension Sitting  Sit with your right / left leg or heel propped on another chair, coffee table, or foot stool.  Allow your leg muscles to relax, letting gravity straighten out your knee.*  You should feel a stretch behind your right / left knee. Hold this position for __________ seconds. Repeat __________ times. Complete this stretch __________ times per day.  *Your physician, physical therapist or athletic trainer may instruct you to place a __________ weight on your thigh, just above your kneecap, to deepen the stretch.  STRETCH - Knee Extension, Prone  Lie on your stomach on a firm surface, such as a bed or countertop. Place your right / left knee and leg just beyond the edge of the surface. You may wish to place a towel under the far end of your right / left thigh for comfort.  Relax your leg muscles and allow gravity to straighten your knee. Your caregiver may advise you to add an ankle weight, if more resistance is helpful for you.  You should feel a stretch in the back of your right / left knee. Hold this position for __________ seconds. Repeat __________ times. Complete this __________ times per day. STRETCH - Quadriceps, Prone  Lie on your stomach on a firm surface, such as a bed or padded floor.  Bend your right / left knee and grasp your ankle. If you are not able to reach your ankle or pant leg, use a belt around your foot to lengthen your reach.  Gently pull your heel toward your buttocks. Your knee should not slide out to the side. You should feel a stretch in the front of your thigh and knee.  Hold this position for __________ seconds. Repeat  __________ times. Complete this stretch __________ times per day.  STRENGTHENING EXERCISES - Tibial Plateau Fracture These exercises may help you when beginning to recover from your injury. Your symptoms may go away with or without further involvement from your physician, physical therapist or athletic trainer. While completing these exercises, remember:   Muscles can gain both the endurance and the strength needed for everyday activities through controlled exercises.  Complete these exercises as instructed by your physician, physical therapist or athletic trainer. Increase the resistance and repetitions only as guided. STRENGTH - Quadriceps, Isometrics  Lie on your back with your right / left leg extended and your opposite knee bent.  Gradually tense the muscles in the front of your right / left thigh. You should see either your knee cap  slide up toward your hip or increased dimpling just above the knee. This motion will push the back of the knee down toward the floor, mat, or bed on which you are lying.  Hold the muscle as tight as you can, without increasing your pain, for __________ seconds.  Relax the muscles slowly and completely between each repetition. Repeat __________ times. Complete this exercise __________ times per day.  STRENGTH - Quadriceps, Short Arcs  Lie on your back. Place a __________ inch towel roll under your knee, so that the knee bends slightly.  Raise only your lower leg, by tightening the muscles in the front of your thigh. Do not allow your thigh to rise.  Hold this position for __________ seconds. Repeat __________ times. Complete this exercise __________ times per day.  OPTIONAL ANKLE WEIGHTS: Begin with ____________________, but DO NOT exceed ____________________. Increase in 1 pound/ 0.5 kilogram increments. STRENGTH - Quadriceps, Straight Leg Raises Quality counts! Watch for signs that the quadriceps muscle is working, to make sure you are strengthening the  correct muscles and not "cheating" by substituting with healthier muscles.  Lay on your back with your right / left leg extended and your opposite knee bent.  Tense the muscles in the front of your right / left thigh. You should see your knee cap slide up, or see increased dimpling just above the knee. Your thigh may even shake a bit.  Tighten these muscles even more, and raise your leg 4 to 6 inches off the floor. Hold for __________ seconds.  Keeping these muscles tense, lower your leg.  Relax the muscles slowly and completely between each repetition. Repeat __________ times. Complete this exercise __________ times per day.  STRENGTH - Hamstring, Isometrics   Lie on your back, on a firm surface.  Bend your right / left knee approximately __________ degrees.  Dig your heel into the surface, as if you are trying to pull it toward your buttocks. Tighten the muscles in the back of your thighs to "dig" as hard as you can, without increasing any pain.  Hold this position for __________ seconds.  Release the tension gradually and allow your muscle to completely relax for __________ seconds between each exercise. Repeat __________ times. Complete this exercise __________ times per day.  STRENGTH - Hamstring, Curls  Lay on your stomach, with your legs extended. (If you lay on a bed, your feet may hang over the edge.)  Tighten the muscles in the back of your thigh to bend your right / left knee up to 90 degrees. Keep your hips flat on the bed or floor.  Hold this position for __________ seconds.  Slowly lower your leg back to the starting position. Repeat __________ times. Complete this exercise __________ times per day.  OPTIONAL ANKLE WEIGHTS: Begin with ____________________, but DO NOT exceed ____________________. Increase in __________ increments.  STRENGTH - Hip Abductors, Straight Leg Raises Be aware of your form throughout the entire exercise, so that you exercise the correct  muscles. Poor form means that you are not strengthening the correct muscles.  Lie on your side, so that your head, shoulders, knee and hip line up. You may bend your lower knee, to help maintain your balance. Your right / left leg should be on top.  Roll your hips slightly forward, so that your hips are stacked directly over each other, and your right / left knee is facing forward.  Lift your top leg up, 4-6 inches, leading with your heel. Be sure that  your foot does not drift forward or that your knee does not roll toward the ceiling.  Hold this position for __________ seconds. You should feel the muscles in your outer hip lifting (you may not notice this until your leg begins to tire).  Slowly lower your leg to the starting position. Allow the muscles to fully relax before starting the next repetition. Repeat __________ times. Complete this exercise __________ times per day.  STRENGTH - Hip Extensors, Bridge   Lie on your back, on a firm surface. Bend your knees and place your feet flat on the floor.  Tighten your buttocks muscles and lift your bottom off the floor, until your trunk is level with your thighs. You should feel the muscles in your buttocks and the back of your thighs working. If you do not feel these muscles, slide your feet 1-2 inches further away from your buttocks.  Hold this position for __________ seconds.  Slowly lower your hips to the starting position, and allow your buttock muscles to relax completely before starting the next repetition.  If this exercise is too easy, you may cross your arms over your chest. Repeat __________ times. Complete this exercise __________ times per day.  STRENGTH - Quadriceps, Squats  Stand in a door frame, so that your feet and knees are in line with the frame.  Use your hands for balance, not support, on the frame.  Slowly lower your weight, bending at the hips and knees. Keep your lower legs (below the knee) upright, so that they  are parallel with the door frame. Squat only within a range that does not increase your knee pain. Never let your hips drop below your knees.  Slowly return to upright, pushing with your legs, not pulling with your hands. Repeat __________ times. Complete this exercise __________ times per day.    This information is not intended to replace advice given to you by your health care provider. Make sure you discuss any questions you have with your health care provider.   FOLLOW UP WITH DR. Barbaraann Barthel ON Monday FOR EVALUATION OF KNEE INJURY. KEEP KNEE IN IMMOBILIZER. DO NT BEND KNEE. DO NOT BEAR WEIGHT ON KNEE. KEEP KNEE ELEVATED AS MUCH AS POSSIBLE. RETURN TO THE EMERGENCY DEPARTMENT IF YOU EXPERIENCE WORSENIG OF YOUR SYMPTOMS, A COOL OR PALE EXTREMITY, NUMBNESS OF LEFT LEG.

## 2015-01-06 NOTE — ED Notes (Signed)
Patient transported to X-ray via stretcher, sr x 2 up

## 2015-01-06 NOTE — ED Notes (Addendum)
I placed knee immobilizer, then was asked to use a "Ambroise" wrap. I used sock material against skin, then applied cotton webril to pad voids, then large Kerlix wrap and completed with ACE bandage. A different and larger knee immobilizer was re-fit to patient's leg. I then gave patient XL paper scrub pants and cut fit. Crutches were fit and adjusted. The crutch use-training was successful.

## 2015-01-08 ENCOUNTER — Ambulatory Visit (INDEPENDENT_AMBULATORY_CARE_PROVIDER_SITE_OTHER): Payer: 59 | Admitting: Family Medicine

## 2015-01-08 ENCOUNTER — Encounter: Payer: Self-pay | Admitting: Family Medicine

## 2015-01-08 VITALS — BP 109/74 | HR 93 | Ht 66.0 in | Wt 230.0 lb

## 2015-01-08 DIAGNOSIS — S8992XA Unspecified injury of left lower leg, initial encounter: Secondary | ICD-10-CM | POA: Diagnosis not present

## 2015-01-08 MED ORDER — OXYCODONE-ACETAMINOPHEN 5-325 MG PO TABS: 1.0000 | ORAL_TABLET | Freq: Four times a day (QID) | ORAL | Status: DC | PRN

## 2015-01-08 NOTE — Patient Instructions (Signed)
You have a tibial plateau fracture. This will take 4-6 weeks to heal. Do not bear weight on this leg in the meantime. Wear immobilizer when up at all times even if you're not putting weight on it. I'm concerned about your lateral meniscus and the ligaments of your knee (ACL based on your exam though in the ED they were concerned about your MCL as well - this feels more sturdy now). We will go ahead with an MRI to further assess. Icing 15 minutes at a time 3-4 times a day. Ibuprofen 600mg  three times a day with food. Percocet as needed for severe pain (no driving on this). Ok to work as long as not taking [employer], [employer].

## 2015-01-10 ENCOUNTER — Telehealth: Payer: Self-pay | Admitting: Family Medicine

## 2015-01-10 DIAGNOSIS — S8992XA Unspecified injury of left lower leg, initial encounter: Secondary | ICD-10-CM | POA: Insufficient documentation

## 2015-01-10 NOTE — Assessment & Plan Note (Signed)
confirmed lateral tibial plateau fracture but exam indicates probable ACL and lateral meniscus tears as well.  Advised to continue with crutches, immobilizer, no weight bearing.  Will go ahead with MRI to confirm the above injuries.  Icing, nsaids with percocet as needed.

## 2015-01-10 NOTE — Progress Notes (Signed)
PCP: No primary care provider on file.  Subjective:   HPI: Patient is a 44 y.o. female here for left knee injury.  Patient reports on 10/22 she jumped off a brick retaining wall that was about 4 feet high onto the ground. When she landed her left knee buckled and she felt a pop within the knee. Unable to bear weight. Pain currently 8/10, throbbing. No prior injuries. Radiographs and CT done in ED - nondepressed lateral tibial plateau fracture. Using crutches and not weight bearing. + swelling. Taking percocet. No skin changes, fever, other complaints.  Past Medical History  Diagnosis Date  . Herpes genitalis in women   . Migraine     Current Outpatient Prescriptions on File Prior to Visit  Medication Sig Dispense Refill  . SUMAtriptan (IMITREX) 100 MG tablet Take 100 mg by mouth every 2 (two) hours as needed.    . valACYclovir (VALTREX) 1000 MG tablet TAKE 1 TABLET BY MOUTH EVERY DAY. NEEDS OFFICE VISIT TO DISCUSS MEDICATION. 30 tablet 1   No current facility-administered medications on file prior to visit.    Past Surgical History  Procedure Laterality Date  . Cesarean section    . Tubal ligation      No Known Allergies  Social History   Social History  . Marital Status: Single    Spouse Name: N/A  . Number of Children: N/A  . Years of Education: N/A   Occupational History  . Not on file.   Social History Main Topics  . Smoking status: Never Smoker   . Smokeless tobacco: Never Used  . Alcohol Use: 0.6 oz/week    1 Glasses of wine per week  . Drug Use: No  . Sexual Activity: Not on file   Other Topics Concern  . Not on file   Social History Narrative    No family history on file.  BP 109/74 mmHg  Pulse 93  Ht 5\' 6"  (1.676 m)  Wt 230 lb (104.327 kg)  BMI 37.14 kg/m2  LMP 12/30/2014 (Approximate)  Review of Systems: See HPI above.    Objective:  Physical Exam:  Gen: NAD  Left knee: Mod effusion.  No bruising, other deformity. TTP  lateral joint line, suprapatellar area. ROM 0 - 90 degrees, painful and slow. Positive ant drawer and lachmanns.  Negative post drawer.  Negative valgus/varus testing. Negative lachmanns. Positive mcmurrays, apleys.  Negative patellar apprehension. NV intact distally.  Right knee: FROM without pain.    Assessment & Plan:  1. Left knee injury - confirmed lateral tibial plateau fracture but exam indicates probable ACL and lateral meniscus tears as well.  Advised to continue with crutches, immobilizer, no weight bearing.  Will go ahead with MRI to confirm the above injuries.  Icing, nsaids with percocet as needed.

## 2015-01-11 NOTE — Addendum Note (Signed)
Addended by: Sherrie George F on: 01/11/2015 01:41 PM   Modules accepted: Orders

## 2015-01-16 NOTE — Telephone Encounter (Signed)
I had spoke with patient regarding her MRI, answered her questions as well.  MRI did show ACL and lateral meniscus tears in addition to the fracture.  She was referred to orthopedics.

## 2015-01-19 ENCOUNTER — Encounter: Payer: Self-pay | Admitting: Family Medicine

## 2016-04-16 ENCOUNTER — Ambulatory Visit (INDEPENDENT_AMBULATORY_CARE_PROVIDER_SITE_OTHER): Payer: 59 | Admitting: Emergency Medicine

## 2016-04-16 ENCOUNTER — Encounter: Payer: Self-pay | Admitting: Emergency Medicine

## 2016-04-16 ENCOUNTER — Ambulatory Visit (INDEPENDENT_AMBULATORY_CARE_PROVIDER_SITE_OTHER): Payer: 59

## 2016-04-16 VITALS — BP 130/82 | HR 79 | Temp 98.3°F | Resp 18 | Ht 66.0 in | Wt 247.0 lb

## 2016-04-16 DIAGNOSIS — R0789 Other chest pain: Secondary | ICD-10-CM

## 2016-04-16 DIAGNOSIS — R079 Chest pain, unspecified: Secondary | ICD-10-CM

## 2016-04-16 NOTE — Patient Instructions (Addendum)
     IF you received an x-ray today, you will receive an invoice from [facility]. Please contact [facility] at [phone] with questions or concerns regarding your invoice.   IF you received labwork today, you will receive an invoice from [place]. Please contact [facility] at [phone] with questions or concerns regarding your invoice.   Our billing staff will not be able to assist you with questions regarding bills from these companies.  You will be contacted with the lab results as soon as they are available. The fastest way to get your results is to activate your My Chart account. Instructions are located on the last page of this paperwork. If you have not heard from [place] regarding the results in 2 weeks, please contact this office.      Chest Wall Pain Introduction Chest wall pain is pain in or around the bones and muscles of your chest. Sometimes, an injury causes this pain. Sometimes, the cause may not be known. This pain may take several weeks or longer to get better. Follow these instructions at home: Pay attention to any changes in your symptoms. Take these actions to help with your pain:  Rest as told by your doctor.  Avoid activities that cause pain. Try not to use your chest, belly (abdominal), or side muscles to lift heavy things.  If directed, apply ice to the painful area:  Put ice in a plastic bag.  Place a towel between your skin and the bag.  Leave the ice on for 20 minutes, 2-3 times per day.  Take over-the-counter and prescription medicines only as told by your doctor.  Do not use tobacco products, including cigarettes, chewing tobacco, and e-cigarettes. If you need help quitting, ask your doctor.  Keep all follow-up visits as told by your doctor. This is important. Contact a doctor if:  You have a fever.  Your chest pain gets worse.  You have new symptoms. Get help right away if:  You feel sick to your stomach (nauseous) or you  throw up (vomit).  You feel sweaty or light-headed.  You have a cough with phlegm (sputum) or you cough up blood.  You are short of breath. This information is not intended to replace advice given to you by your health care provider. Make sure you discuss any questions you have with your health care provider. Document Released: 08/20/2007 Document Revised: 08/09/2015 Document Reviewed: 05/29/2014  2017 Elsevier

## 2016-04-16 NOTE — Progress Notes (Signed)
[name] [name] 46 y.o.   Chief Complaint  Patient presents with  . Chest Pain    SHARP PAIN WHEN BREATHES IN RADIATES TO L ARM     HISTORY OF PRESENT ILLNESS: This is a 46 y.o. female complaining of left sided constant sharp pain under left breast since yesterday 6pm, worse with deep breathing and movement of left arm.  Chest Pain   This is a new problem. The current episode started yesterday. The problem occurs constantly. The problem has been unchanged. Pain location: left chest under breast. The pain is at a severity of 3/10. The pain is mild. The quality of the pain is described as stabbing (sharp). The pain radiates to the left shoulder. Pertinent negatives include no abdominal pain, back pain, claudication, cough, diaphoresis, dizziness, exertional chest pressure, fever, headaches, hemoptysis, irregular heartbeat, malaise/fatigue, nausea, near-syncope, numbness, palpitations, shortness of breath, syncope, vomiting or weakness. The pain is aggravated by deep breathing, movement and lifting arm. She has tried nothing for the symptoms. There are no known risk factors.  Pertinent negatives for past medical history include no aneurysm, no anxiety/panic attacks, no aortic aneurysm, no aortic dissection, no arrhythmia, no CAD, no cancer, no COPD, no CHF, no diabetes, no DVT, no hyperlipidemia, no MI, no PE, no PVD, no spontaneous pneumothorax and no strokes.  Her family medical history is significant for CAD (father). Prior workup: none.     Prior to Admission medications   Medication Sig Start Date End Date Taking? Authorizing Provider  SUMAtriptan (IMITREX) 100 MG tablet Take 100 mg by mouth every 2 (two) hours as needed.   Yes Historical Provider, MD  valACYclovir (VALTREX) 1000 MG tablet TAKE 1 TABLET BY MOUTH EVERY DAY. NEEDS OFFICE VISIT TO DISCUSS MEDICATION. 08/07/11  Yes Argentina Donovan, PA-C  oxyCODONE-acetaminophen (PERCOCET/ROXICET) 5-325 MG tablet Take 1 tablet by mouth every 6  (six) hours as needed for severe pain. Patient not taking: Reported on 04/16/2016 01/08/15   Dene Gentry, MD    No Known Allergies  Patient Active Problem List   Diagnosis Date Noted  . Left knee injury 01/10/2015    Past Medical History:  Diagnosis Date  . Anxiety   . Cancer ([place]) 1995  . Herpes genitalis in women   . Migraine     Past Surgical History:  Procedure Laterality Date  . ANTERIOR CRUCIATE LIGAMENT REPAIR    . CESAREAN SECTION    . TUBAL LIGATION      Social History   Social History  . Marital status: Single    Spouse name: N/A  . Number of children: N/A  . Years of education: N/A   Occupational History  . Not on file.   Social History Main Topics  . Smoking status: Never Smoker  . Smokeless tobacco: Never Used  . Alcohol use 0.6 oz/week    1 Glasses of wine per week  . Drug use: No  . Sexual activity: Not on file   Other Topics Concern  . Not on file   Social History Narrative  . No narrative on file    History reviewed. No pertinent family history.   Review of Systems  Constitutional: Negative for diaphoresis, fever and malaise/fatigue.  HENT: Negative.   Eyes: Negative for discharge and redness.  Respiratory: Negative for cough, hemoptysis, shortness of breath and wheezing.   Cardiovascular: Positive for chest pain. Negative for palpitations, claudication, syncope and near-syncope.  Gastrointestinal: Negative for abdominal pain, diarrhea, nausea and vomiting.  Genitourinary:  Negative.   Musculoskeletal: Negative for back pain.  Skin: Negative.   Neurological: Negative for dizziness, weakness, numbness and headaches.  Endo/Heme/Allergies: Negative.   Psychiatric/Behavioral: Negative.   All other systems reviewed and are negative.  Vitals:   04/16/16 1004  BP: 130/82  Pulse: 79  Resp: 18  Temp: 98.3 F (36.8 C)     Physical Exam  Constitutional: She is oriented to person, place, and time. She appears well-developed and  well-nourished.  HENT:  Head: Normocephalic and atraumatic.  Mouth/Throat: Oropharynx is clear and moist. No oropharyngeal exudate.  Eyes: Conjunctivae and EOM are normal. Pupils are equal, round, and reactive to light.  Neck: Normal range of motion. Neck supple. No JVD present. No thyromegaly present.  Cardiovascular: Normal rate, regular rhythm and normal heart sounds.   Pulmonary/Chest: Effort normal and breath sounds normal. She has no wheezes. She has no rales. She exhibits tenderness (under left breast and lateral wall).  Abdominal: Soft. Bowel sounds are normal. She exhibits no distension. There is no tenderness.  Musculoskeletal: Normal range of motion.  Lymphadenopathy:    She has no cervical adenopathy.  Neurological: She is alert and oriented to person, place, and time. No sensory deficit. She exhibits normal muscle tone.  Skin: Skin is warm and dry. Capillary refill takes less than 2 seconds.  Psychiatric: She has a normal mood and affect. Her behavior is normal.  Vitals reviewed.  CXR reviewed: NAD EKG: NSR, no acute ischemic changes.  ASSESSMENT & PLAN: Ranique was seen today for chest pain.  Diagnoses and all orders for this visit:  Chest pain, unspecified type -     EKG 12-Lead -     DG Chest 2 View; Future -     CBC with Differential/Platelet -     Comprehensive metabolic panel -     Lipid panel -     Ambulatory referral to Cardiology  Chest wall tenderness   Chest pain does not appear to be cardiac in origin, however given pt's age and Fhx she needs outpatient cardiac workup to r/o underlying CAD.  Patient Instructions       IF you received an x-ray today, you will receive an invoice from [facility]. Please contact [facility] at [phone] with questions or concerns regarding your invoice.   IF you received labwork today, you will receive an invoice from [place]. Please contact [facility] at [phone] with questions or concerns  regarding your invoice.   Our billing staff will not be able to assist you with questions regarding bills from these companies.  You will be contacted with the lab results as soon as they are available. The fastest way to get your results is to activate your My Chart account. Instructions are located on the last page of this paperwork. If you have not heard from [place] regarding the results in 2 weeks, please contact this office.      Chest Wall Pain Introduction Chest wall pain is pain in or around the bones and muscles of your chest. Sometimes, an injury causes this pain. Sometimes, the cause may not be known. This pain may take several weeks or longer to get better. Follow these instructions at home: Pay attention to any changes in your symptoms. Take these actions to help with your pain:  Rest as told by your doctor.  Avoid activities that cause pain. Try not to use your chest, belly (abdominal), or side muscles to lift heavy things.  If directed, apply ice to the painful  area:  Put ice in a plastic bag.  Place a towel between your skin and the bag.  Leave the ice on for 20 minutes, 2-3 times per day.  Take over-the-counter and prescription medicines only as told by your doctor.  Do not use tobacco products, including cigarettes, chewing tobacco, and e-cigarettes. If you need help quitting, ask your doctor.  Keep all follow-up visits as told by your doctor. This is important. Contact a doctor if:  You have a fever.  Your chest pain gets worse.  You have new symptoms. Get help right away if:  You feel sick to your stomach (nauseous) or you throw up (vomit).  You feel sweaty or light-headed.  You have a cough with phlegm (sputum) or you cough up blood.  You are short of breath. This information is not intended to replace advice given to you by your health care provider. Make sure you discuss any questions you have with your health care provider. Document Released:  08/20/2007 Document Revised: 08/09/2015 Document Reviewed: 05/29/2014  2017 Elsevier      Agustina Caroli, MD [facility]

## 2016-04-17 LAB — CBC WITH DIFFERENTIAL/PLATELET
BASOS: 0 %
Basophils Absolute: 0 10*3/uL (ref 0.0–0.2)
EOS (ABSOLUTE): 0.1 10*3/uL (ref 0.0–0.4)
Eos: 1 %
Hematocrit: 40.9 % (ref 34.0–46.6)
Hemoglobin: 13.9 g/dL (ref 11.1–15.9)
IMMATURE GRANS (ABS): 0 10*3/uL (ref 0.0–0.1)
Immature Granulocytes: 0 %
LYMPHS ABS: 2 10*3/uL (ref 0.7–3.1)
LYMPHS: 30 %
MCH: 31.2 pg (ref 26.6–33.0)
MCHC: 34 g/dL (ref 31.5–35.7)
MCV: 92 fL (ref 79–97)
Monocytes Absolute: 0.4 10*3/uL (ref 0.1–0.9)
Monocytes: 6 %
NEUTROS ABS: 4.1 10*3/uL (ref 1.4–7.0)
Neutrophils: 63 %
PLATELETS: 345 10*3/uL (ref 150–379)
RBC: 4.45 x10E6/uL (ref 3.77–5.28)
RDW: 13 % (ref 12.3–15.4)
WBC: 6.5 10*3/uL (ref 3.4–10.8)

## 2016-04-17 LAB — COMPREHENSIVE METABOLIC PANEL
A/G RATIO: 1.5 (ref 1.2–2.2)
ALBUMIN: 4.8 g/dL (ref 3.5–5.5)
ALT: 15 IU/L (ref 0–32)
AST: 15 IU/L (ref 0–40)
Alkaline Phosphatase: 110 IU/L (ref 39–117)
BILIRUBIN TOTAL: 0.3 mg/dL (ref 0.0–1.2)
BUN / CREAT RATIO: 19 (ref 9–23)
BUN: 18 mg/dL (ref 6–24)
CHLORIDE: 99 mmol/L (ref 96–106)
CO2: 23 mmol/L (ref 18–29)
Calcium: 10.3 mg/dL — ABNORMAL HIGH (ref 8.7–10.2)
Creatinine, Ser: 0.97 mg/dL (ref 0.57–1.00)
GFR calc non Af Amer: 70 mL/min/{1.73_m2} (ref >59)
GFR, EST AFRICAN AMERICAN: 81 mL/min/{1.73_m2} (ref >59)
Globulin, Total: 3.1 g/dL (ref 1.5–4.5)
Glucose: 94 mg/dL (ref 65–99)
POTASSIUM: 4.4 mmol/L (ref 3.5–5.2)
Sodium: 139 mmol/L (ref 134–144)
TOTAL PROTEIN: 7.9 g/dL (ref 6.0–8.5)

## 2016-04-17 LAB — LIPID PANEL
Chol/HDL Ratio: 3.1 ratio units (ref 0.0–4.4)
Cholesterol, Total: 206 mg/dL — ABNORMAL HIGH (ref 100–199)
HDL: 66 mg/dL (ref >39)
LDL Calculated: 129 mg/dL — ABNORMAL HIGH (ref 0–99)
Triglycerides: 54 mg/dL (ref 0–149)
VLDL Cholesterol Cal: 11 mg/dL (ref 5–40)

## 2016-04-18 ENCOUNTER — Telehealth: Payer: Self-pay

## 2016-04-18 NOTE — Telephone Encounter (Incomplete)
Pt needs lab results as well and cardiology assignment.. Please contact as she was told results should have been posed by yesterday 04/17/2016.  She is unhappy as she states the concern we expressed about

## 2016-04-21 ENCOUNTER — Encounter: Payer: Self-pay | Admitting: *Deleted

## 2016-04-21 NOTE — Telephone Encounter (Signed)
Called pt to let her know we referred her to [facility] at [address]

## 2016-04-23 ENCOUNTER — Telehealth: Payer: Self-pay

## 2016-04-23 NOTE — Telephone Encounter (Signed)
Pt has questions about her labs that are on mychart but does not understand results   And she needs to check with referrals about her specialist referral that has not happened yet   Best number [phone]

## 2016-04-24 NOTE — Telephone Encounter (Signed)
Pt advised of nl lab

## 2016-05-16 ENCOUNTER — Encounter: Payer: Self-pay | Admitting: Cardiology

## 2016-05-22 NOTE — Progress Notes (Deleted)
Cardiology Office Note   Date:  05/22/2016   ID:  [name] [name], DOB [birth year], MRN [id number]  PCP:  No primary care provider on file.  Cardiologist:   Minus Breeding, MD  Referring:  ***  No chief complaint on file.     History of Present Illness: [name] [name] is a 46 y.o. female who presents for ***    Past Medical History:  Diagnosis Date  . Anxiety   . Cancer ([place]) 1995  . Herpes genitalis in women   . Migraine     Past Surgical History:  Procedure Laterality Date  . ANTERIOR CRUCIATE LIGAMENT REPAIR    . CESAREAN SECTION    . TUBAL LIGATION       Current Outpatient Prescriptions  Medication Sig Dispense Refill  . oxyCODONE-acetaminophen (PERCOCET/ROXICET) 5-325 MG tablet Take 1 tablet by mouth every 6 (six) hours as needed for severe pain. (Patient not taking: Reported on 04/16/2016) 60 tablet 0  . SUMAtriptan (IMITREX) 100 MG tablet Take 100 mg by mouth every 2 (two) hours as needed.    . valACYclovir (VALTREX) 1000 MG tablet TAKE 1 TABLET BY MOUTH EVERY DAY. NEEDS OFFICE VISIT TO DISCUSS MEDICATION. 30 tablet 1   No current facility-administered medications for this visit.     Allergies:   Patient has no known allergies.    Social History:  The patient  reports that she has never smoked. She has never used smokeless tobacco. She reports that she drinks about 0.6 oz of alcohol per week . She reports that she does not use drugs.   Family History:  The patient's ***family history is not on file.    ROS:  Please see the history of present illness.   Otherwise, review of systems are positive for {NONE DEFAULTED:18576::"none"}.   All other systems are reviewed and negative.    PHYSICAL EXAM: VS:  There were no vitals taken for this visit. , BMI There is no height or weight on file to calculate BMI. GENERAL:  Well appearing HEENT:  Pupils equal round and reactive, fundi not visualized, oral mucosa unremarkable NECK:  No jugular venous distention,  waveform within normal limits, carotid upstroke brisk and symmetric, no bruits, no thyromegaly LYMPHATICS:  No cervical, inguinal adenopathy LUNGS:  Clear to auscultation bilaterally BACK:  No CVA tenderness CHEST:  Unremarkable HEART:  PMI not displaced or sustained,S1 and S2 within normal limits, no S3, no S4, no clicks, no rubs, *** murmurs ABD:  Flat, positive bowel sounds normal in frequency in pitch, no bruits, no rebound, no guarding, no midline pulsatile mass, no hepatomegaly, no splenomegaly EXT:  2 plus pulses throughout, no edema, no cyanosis no clubbing SKIN:  No rashes no nodules NEURO:  Cranial nerves II through XII grossly intact, motor grossly intact throughout PSYCH:  Cognitively intact, oriented to person place and time    EKG:  EKG {ACTION; IS/IS KGM:01027253} ordered today. The ekg ordered today demonstrates ***   Recent Labs: 04/16/2016: ALT 15; BUN 18; Creatinine, Ser 0.97; Platelets 345; Potassium 4.4; Sodium 139    Lipid Panel    Component Value Date/Time   CHOL 206 (H) 04/16/2016 1031   TRIG 54 04/16/2016 1031   HDL 66 04/16/2016 1031   CHOLHDL 3.1 04/16/2016 1031   LDLCALC 129 (H) 04/16/2016 1031      Wt Readings from Last 3 Encounters:  04/16/16 247 lb (112 kg)  01/08/15 230 lb (104.3 kg)  01/06/15 240 lb (108.9 kg)  Other studies Reviewed: Additional studies/ records that were reviewed today include: ***. Review of the above records demonstrates:  Please see elsewhere in the note.  ***   ASSESSMENT AND PLAN:  ***   Current medicines are reviewed at length with the patient today.  The patient {ACTIONS; HAS/DOES NOT HAVE:19233} concerns regarding medicines.  The following changes have been made:  {PLAN; NO CHANGE:13088:s}  Labs/ tests ordered today include: *** No orders of the defined types were placed in this encounter.    Disposition:   FU with ***    Signed, Minus Breeding, MD  05/22/2016 8:53 PM    [facility]

## 2016-05-23 ENCOUNTER — Ambulatory Visit: Payer: 59 | Admitting: Cardiology

## 2016-05-26 ENCOUNTER — Encounter: Payer: Self-pay | Admitting: *Deleted

## 2016-07-10 DIAGNOSIS — Z719 Counseling, unspecified: Secondary | ICD-10-CM | POA: Diagnosis not present

## 2016-07-17 DIAGNOSIS — Z719 Counseling, unspecified: Secondary | ICD-10-CM | POA: Diagnosis not present

## 2016-07-24 DIAGNOSIS — Z719 Counseling, unspecified: Secondary | ICD-10-CM | POA: Diagnosis not present

## 2016-07-31 DIAGNOSIS — Z719 Counseling, unspecified: Secondary | ICD-10-CM | POA: Diagnosis not present

## 2016-11-01 IMAGING — CT CT ANGIO EXTREM LOW*L*
2 of 12 series · 13 of 46 positions shown, 19 images · IV contrast (APPLIED)
Comparison: Left knee radiographs dated 01/06/2015

CLINICAL DATA: Left knee pain/swelling, possible knee dislocation,
evaluate knee vasculature

EXAM:
CT ANGIOGRAPHY LOWER LEFT EXTREMITY
CONTRAST:  100mL OMNIPAQUE IOHEXOL 350 MG/ML SOLN

[Series 5: celiac to knee 1.0 b26f · axial · 0.58mm/px · z∈[-898,-269]mm · 12 of 1468 slices shown, 17 images]
[im 105/1468  soft-tissue]
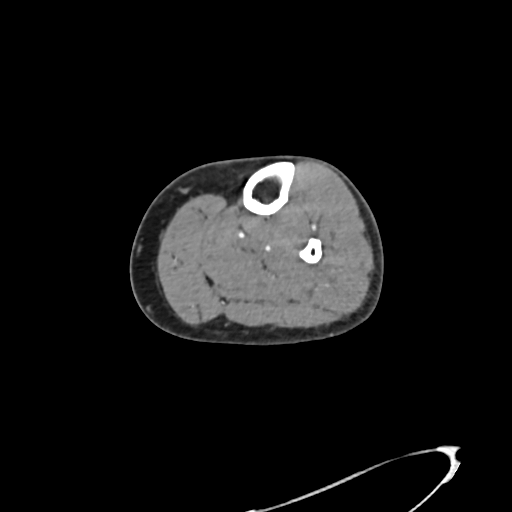
[im 105/1468  bone]
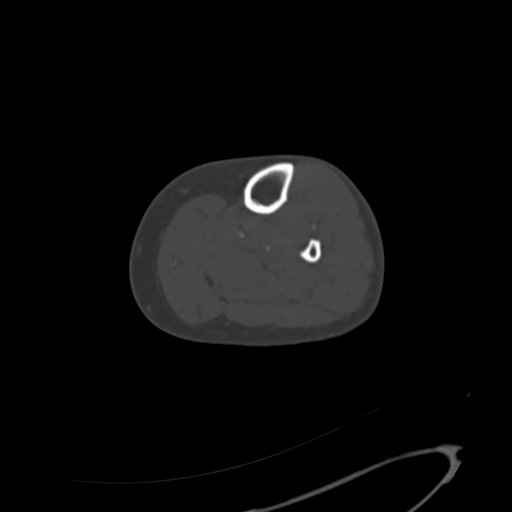
[im 210/1468  soft-tissue]
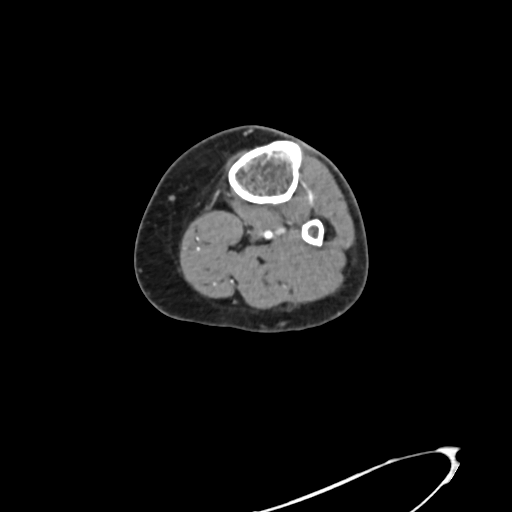
[im 315/1468  soft-tissue]
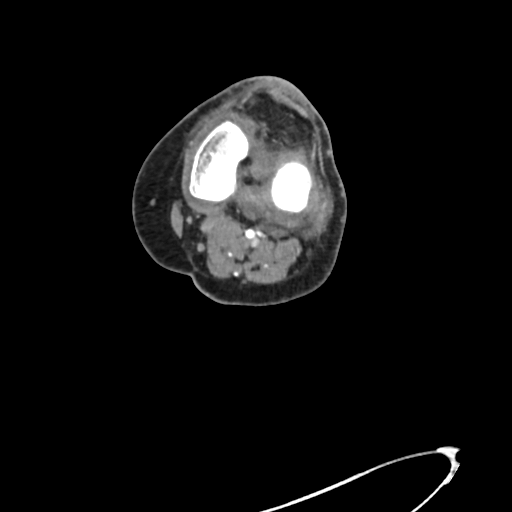
[im 524/1468  soft-tissue]
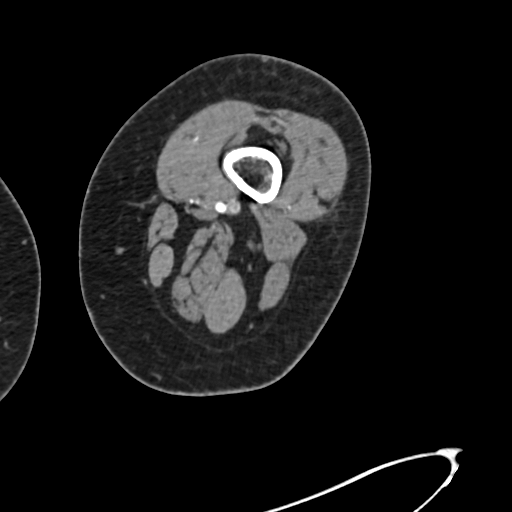
[im 629/1468  soft-tissue]
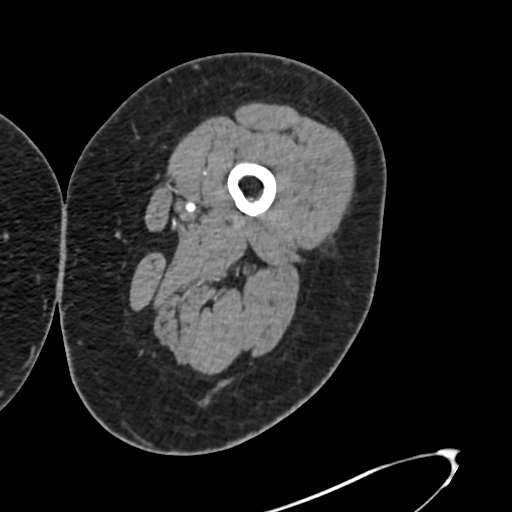
[im 734/1468  soft-tissue]
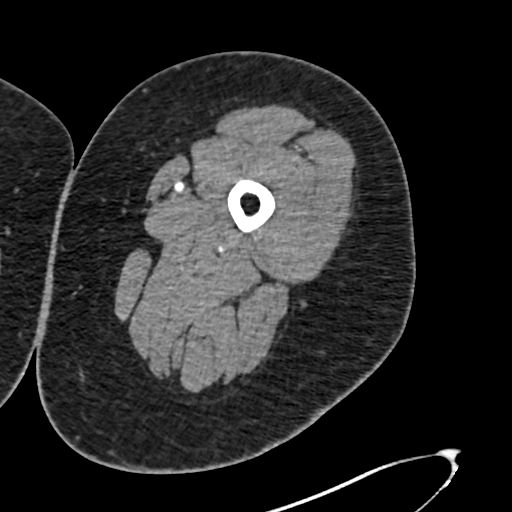
[im 839/1468  soft-tissue]
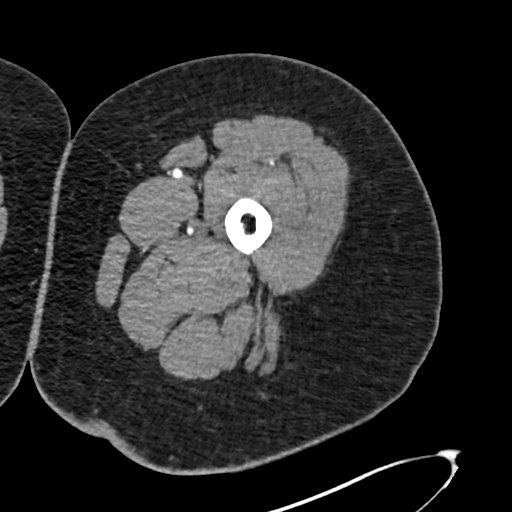
[im 944/1468  soft-tissue]
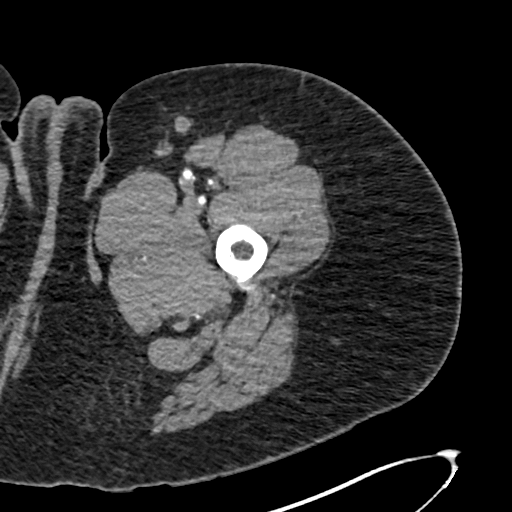
[im 1048/1468  lung]
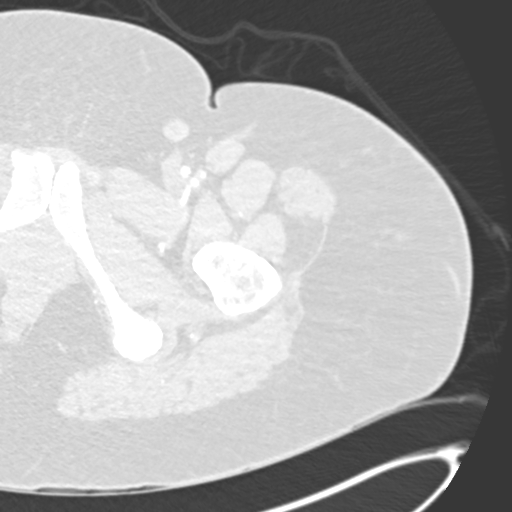
[im 1153/1468  soft-tissue]
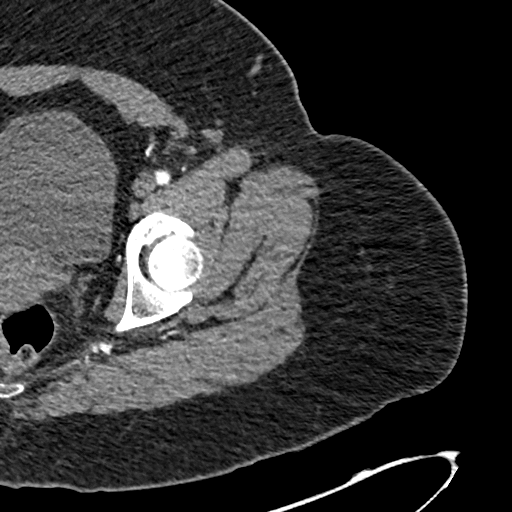
[im 1153/1468  lung]
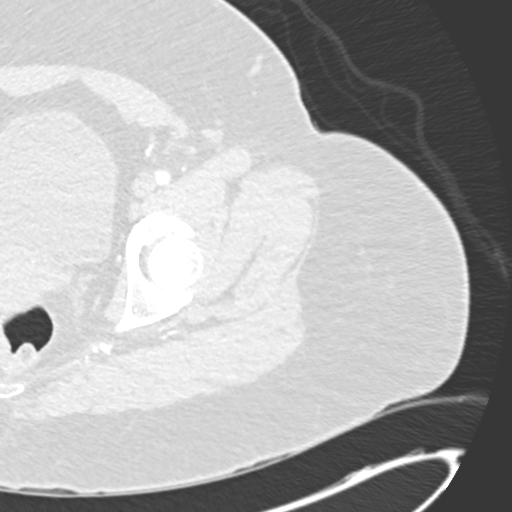
[im 1153/1468  bone]
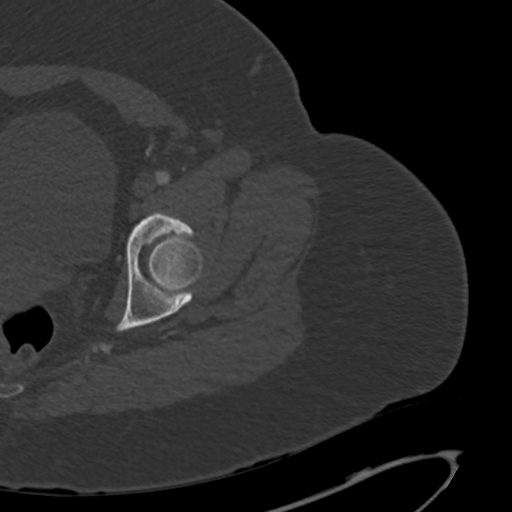
[im 1258/1468  soft-tissue]
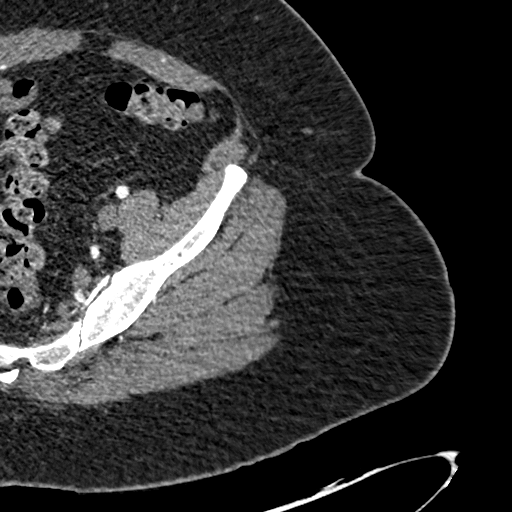
[im 1258/1468  lung]
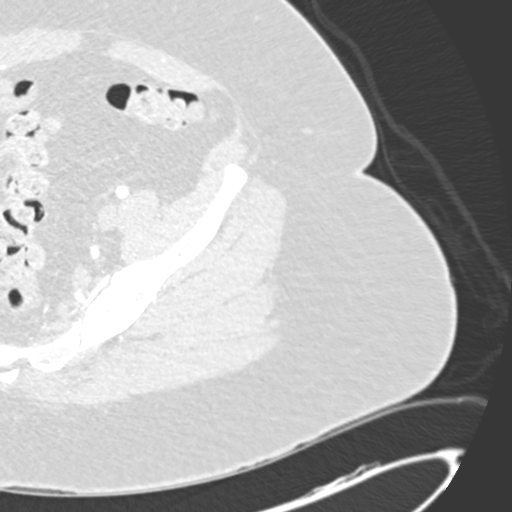
[im 1363/1468  soft-tissue]
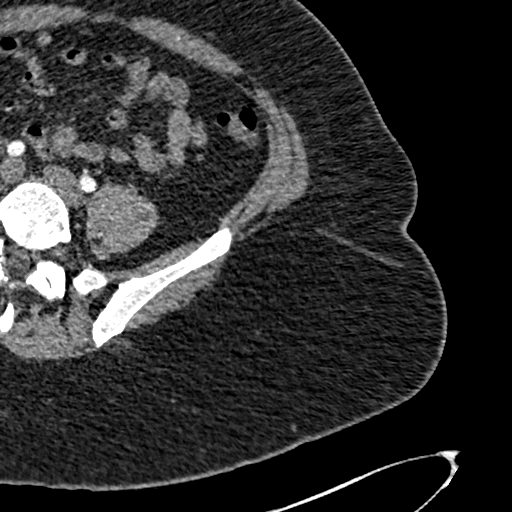
[im 1363/1468  lung]
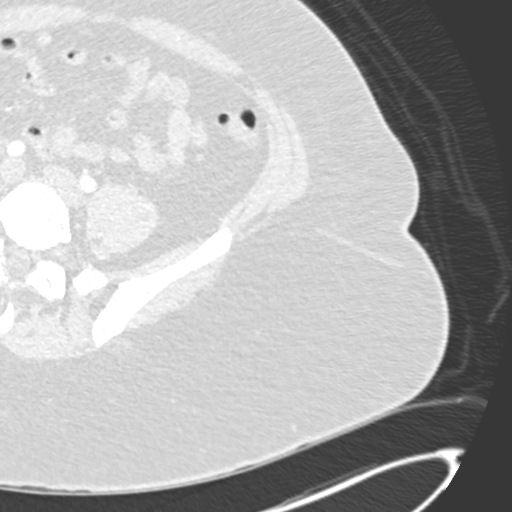

[Series 8: celiac to knee 2.0 coronal · coronal · 0.99mm/px · 1 of 168 slices shown, 2 images]
[im 84/168  soft-tissue]
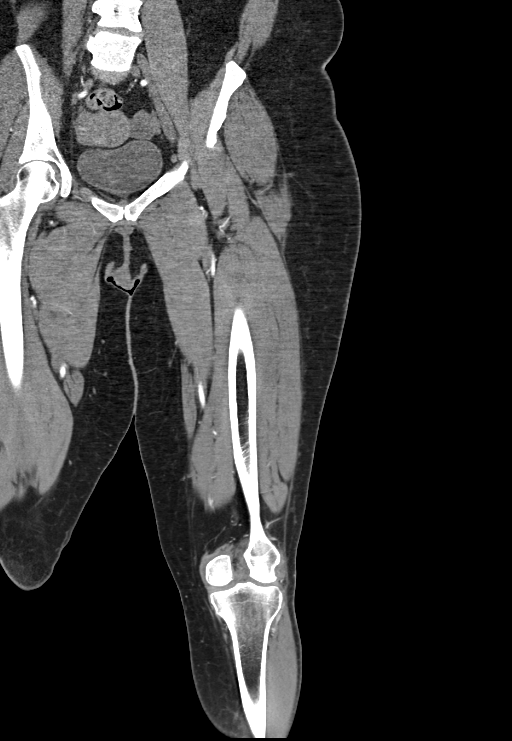
[im 84/168  bone]
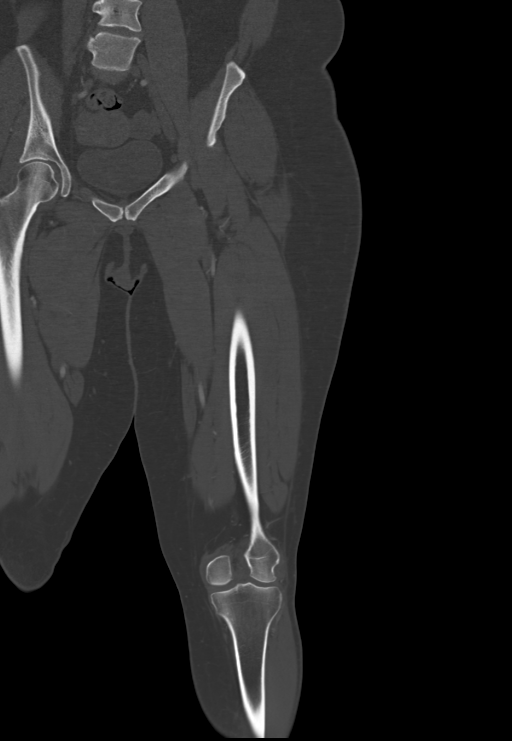

[13 of 46 positions shown; findings below may reference images not displayed]

FINDINGS: Patent 3-vessel runoff to the ankle. No evidence of traumatic
pseudoaneurysm.

Suspected nondepressed posterior lateral tibial plateau fracture
(series 6/image 34; series 8/image 91).

Associated moderate suprapatellar knee joint effusion with
lipohemarthrosis (series 9/ image 152).

No evidence of fluid collection or intramuscular hematoma.

Review of the MIP images confirms the above findings.
IMPRESSION: Patent 3 vessel runoff to the ankle. No evidence traumatic
pseudoaneurysm.

Suspected nondepressed posterolateral tibial plateau fracture.

Associated moderate suprapatellar knee joint effusion with
lipohemarthrosis.

## 2016-11-01 IMAGING — DX DG KNEE COMPLETE 4+V*L*
4 series · 4 of 4 positions shown · non-contrast
Comparison: None

CLINICAL DATA: Jumped from a retaining wall at a football game
today injuring LEFT knee, immediate pain at sites of joint running
down lower leg, knee buckles under pressure, initial encounter

EXAM:
LEFT KNEE - COMPLETE 4+ VIEW

[knee ap]
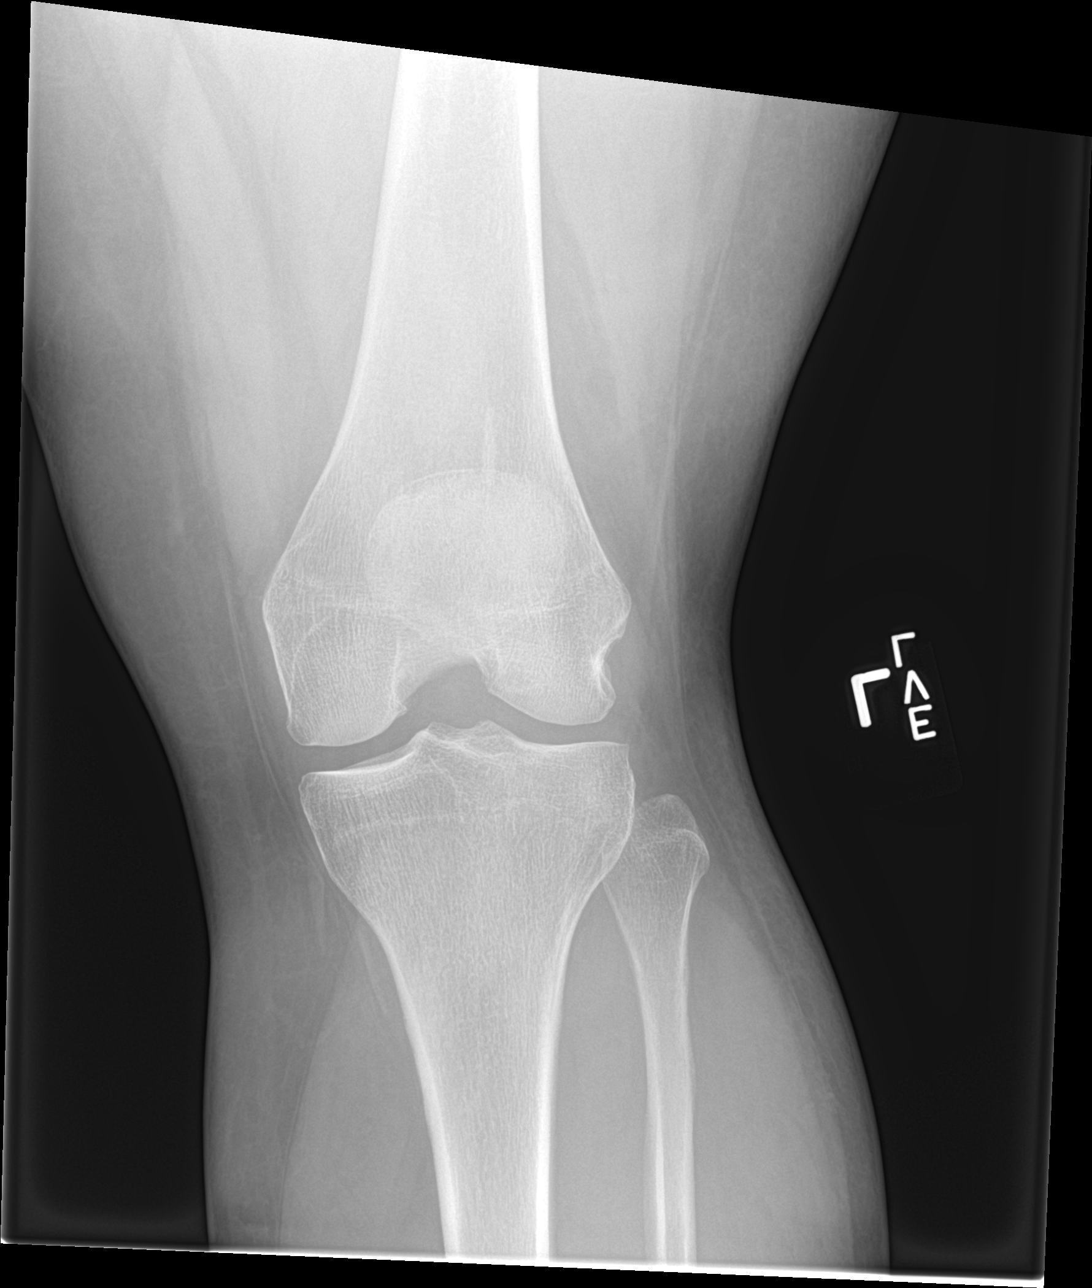

[knee lat]
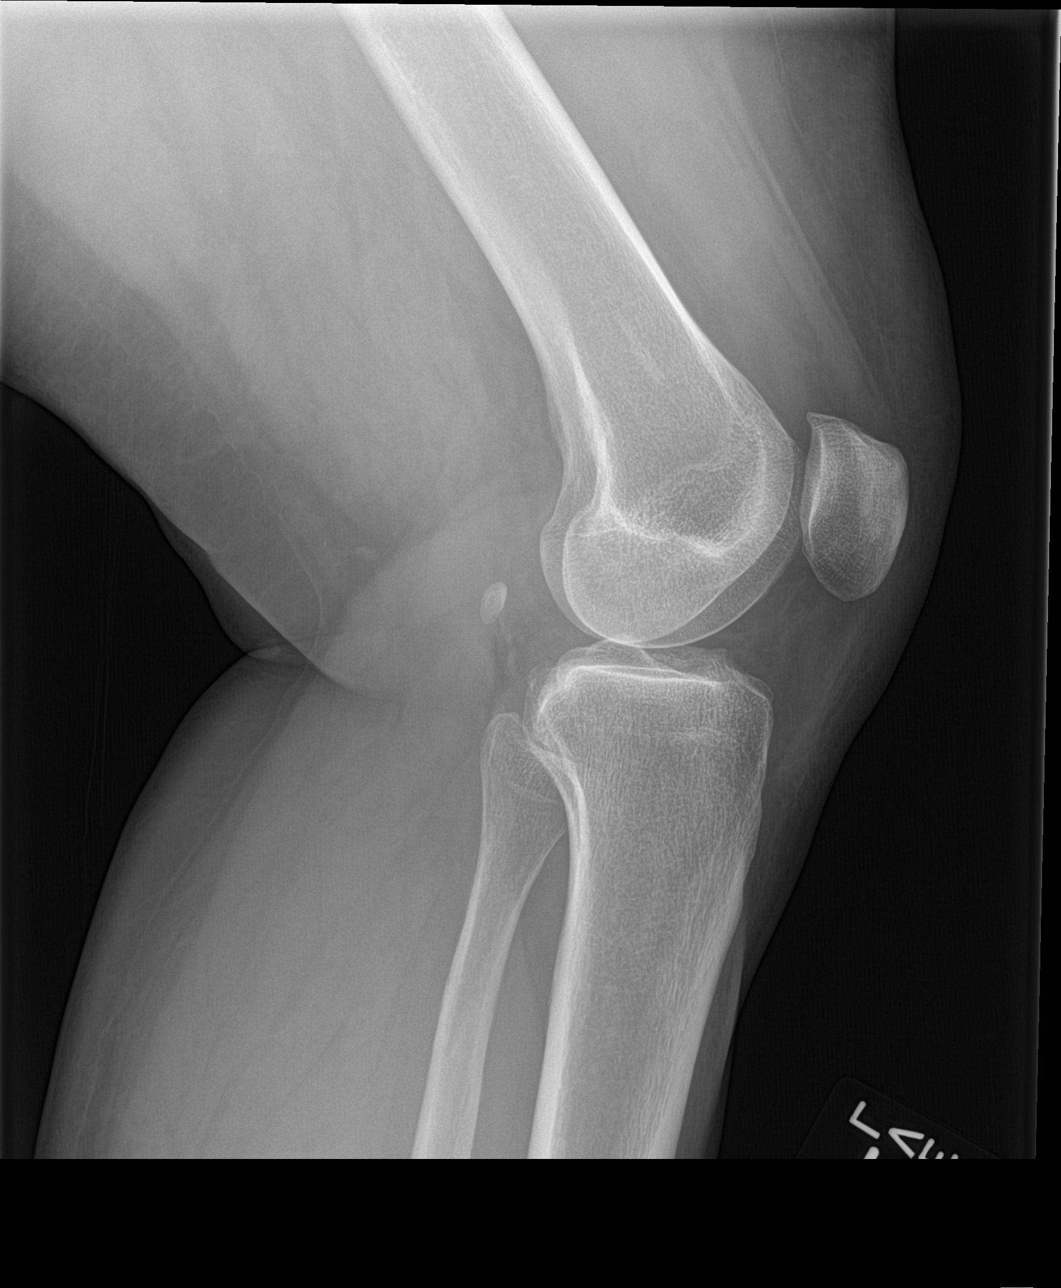

[knee obl (1 of 2)]
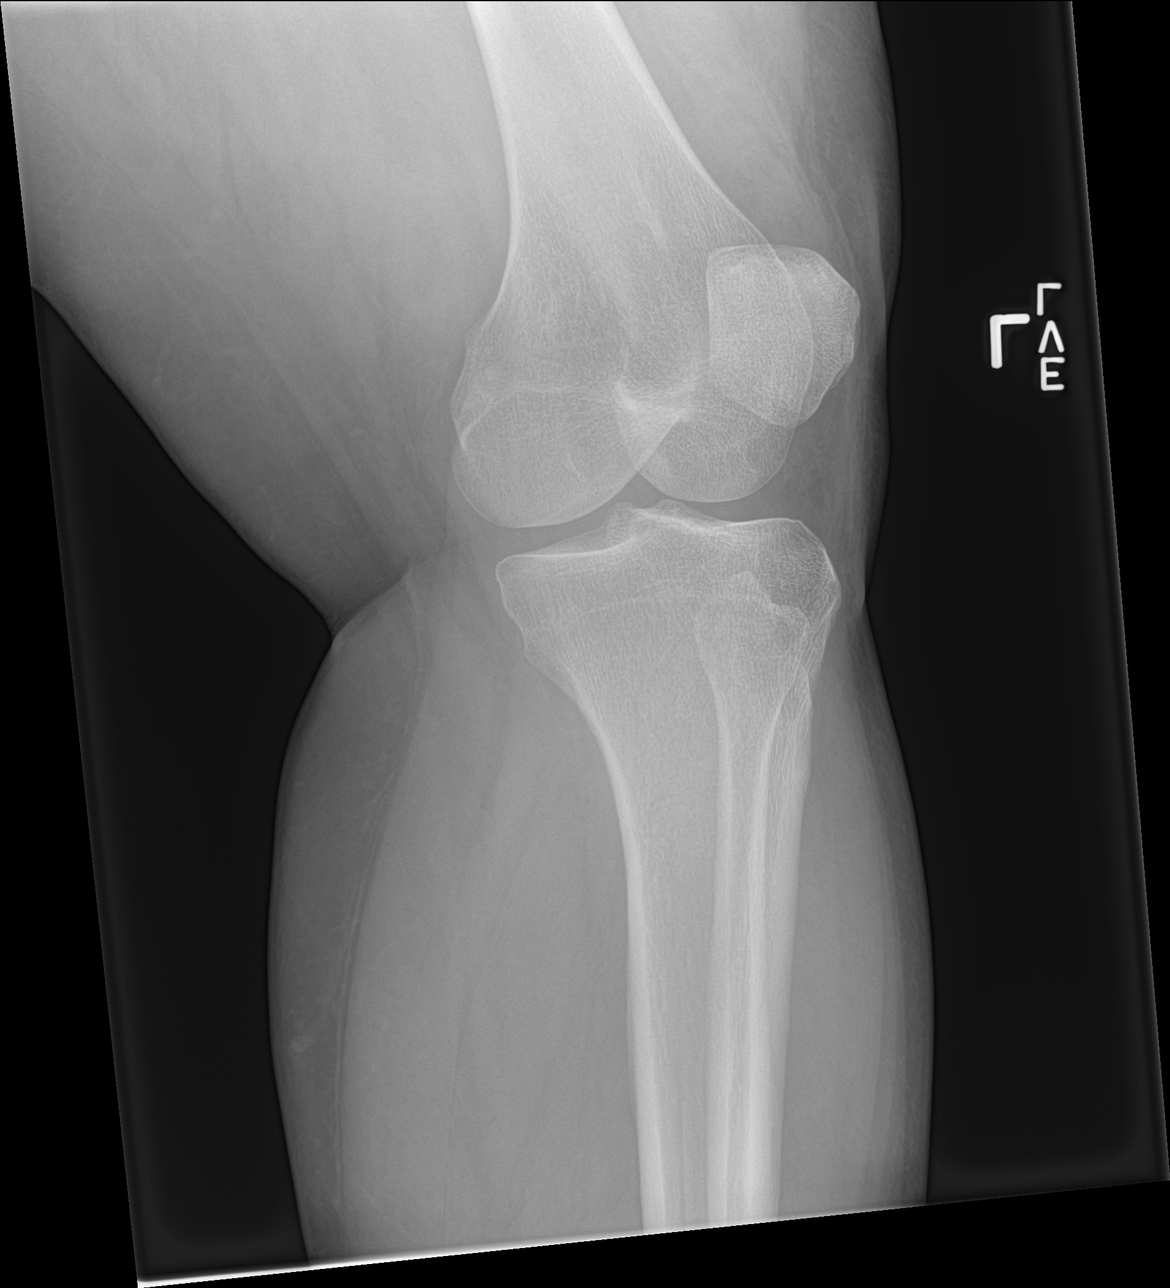

[knee obl (2 of 2)]
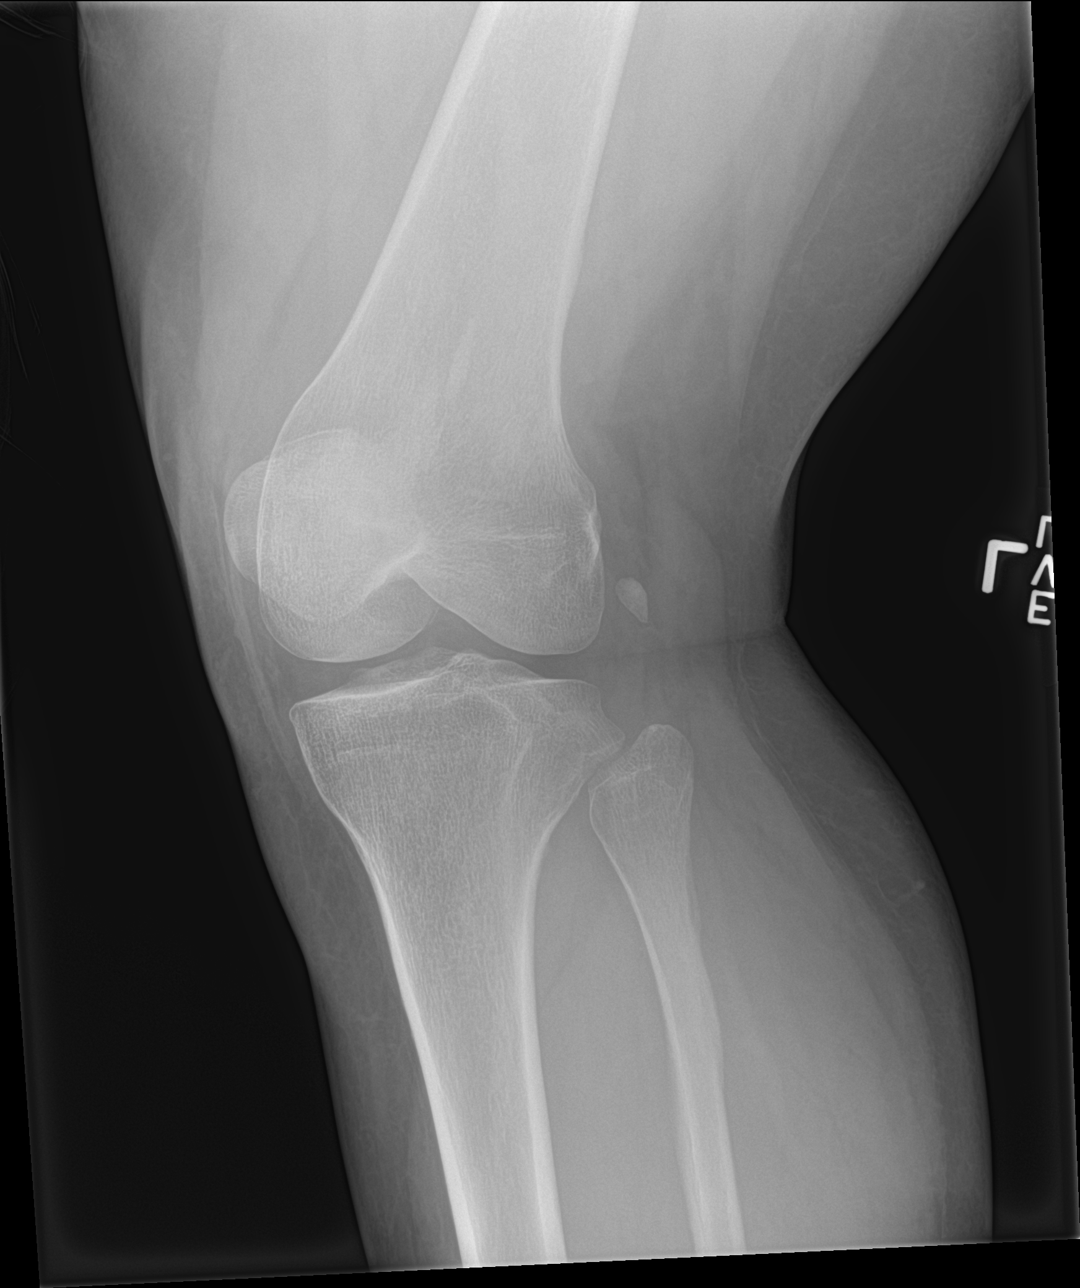

[4 of 4 positions shown; findings below may reference images not displayed]

FINDINGS: Osseous mineralization normal.

Joint spaces preserved.

Small knee joint effusion.

No acute fracture, dislocation or bone destruction.
IMPRESSION: Small knee joint effusion without acute fracture dislocation.

## 2016-11-28 DIAGNOSIS — Z23 Encounter for immunization: Secondary | ICD-10-CM | POA: Diagnosis not present

## 2017-09-24 ENCOUNTER — Encounter (HOSPITAL_BASED_OUTPATIENT_CLINIC_OR_DEPARTMENT_OTHER): Payer: Self-pay | Admitting: Emergency Medicine

## 2017-09-24 ENCOUNTER — Emergency Department (HOSPITAL_BASED_OUTPATIENT_CLINIC_OR_DEPARTMENT_OTHER): Payer: 59

## 2017-09-24 ENCOUNTER — Other Ambulatory Visit: Payer: Self-pay

## 2017-09-24 ENCOUNTER — Emergency Department (HOSPITAL_BASED_OUTPATIENT_CLINIC_OR_DEPARTMENT_OTHER)
Admission: EM | Admit: 2017-09-24 | Discharge: 2017-09-24 | Disposition: A | Discharge status: Home / Self Care | Payer: 59 | Attending: Emergency Medicine | Admitting: Emergency Medicine

## 2017-09-24 DIAGNOSIS — Y939 Activity, unspecified: Secondary | ICD-10-CM | POA: Insufficient documentation

## 2017-09-24 DIAGNOSIS — Y929 Unspecified place or not applicable: Secondary | ICD-10-CM | POA: Diagnosis not present

## 2017-09-24 DIAGNOSIS — M542 Cervicalgia: Secondary | ICD-10-CM | POA: Diagnosis not present

## 2017-09-24 DIAGNOSIS — S161XXA Strain of muscle, fascia and tendon at neck level, initial encounter: Secondary | ICD-10-CM

## 2017-09-24 DIAGNOSIS — Y999 Unspecified external cause status: Secondary | ICD-10-CM | POA: Insufficient documentation

## 2017-09-24 DIAGNOSIS — S39012A Strain of muscle, fascia and tendon of lower back, initial encounter: Secondary | ICD-10-CM | POA: Insufficient documentation

## 2017-09-24 DIAGNOSIS — M545 Low back pain: Secondary | ICD-10-CM | POA: Diagnosis not present

## 2017-09-24 DIAGNOSIS — Z79899 Other long term (current) drug therapy: Secondary | ICD-10-CM | POA: Diagnosis not present

## 2017-09-24 DIAGNOSIS — S199XXA Unspecified injury of neck, initial encounter: Secondary | ICD-10-CM | POA: Diagnosis not present

## 2017-09-24 MED ORDER — NAPROXEN 500 MG PO TABS: 500.0000 mg | ORAL_TABLET | Freq: Two times a day (BID) | ORAL | 0 refills | Status: DC

## 2017-09-24 MED ORDER — METHOCARBAMOL 500 MG PO TABS: 500.0000 mg | ORAL_TABLET | Freq: Two times a day (BID) | ORAL | 0 refills | Status: DC

## 2017-09-24 MED ORDER — KETOROLAC TROMETHAMINE 30 MG/ML IJ SOLN
30.0000 mg | Freq: Once | INTRAMUSCULAR | Status: AC
  Administered 2017-09-24: 30 mg via INTRAMUSCULAR
  Filled 2017-09-24: qty 1

## 2017-09-24 MED FILL — NAPROXEN 500 MG TABLET: 500 | 15 days supply | Qty: 30 | Fill #0

## 2017-09-24 MED FILL — METHOCARBAMOL 500 MG TABLET: 500 | 10 days supply | Qty: 20 | Fill #0
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
## 2017-09-24 NOTE — Discharge Instructions (Addendum)
Naproxen for pain and inflammation.  Take Robaxin for muscle spasms.  Try heating pads, stretches.  Follow-up with family doctor if not improving.
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
## 2017-09-25 ENCOUNTER — Ambulatory Visit: Payer: 59 | Admitting: Urgent Care

## 2017-10-27 ENCOUNTER — Other Ambulatory Visit (HOSPITAL_BASED_OUTPATIENT_CLINIC_OR_DEPARTMENT_OTHER): Payer: Self-pay | Admitting: Physician Assistant

## 2017-10-27 DIAGNOSIS — Z1239 Encounter for other screening for malignant neoplasm of breast: Secondary | ICD-10-CM

## 2017-11-03 ENCOUNTER — Inpatient Hospital Stay (HOSPITAL_BASED_OUTPATIENT_CLINIC_OR_DEPARTMENT_OTHER): Admission: RE | Admit: 2017-11-03 | Payer: 59 | Source: Ambulatory Visit

## 2017-11-06 ENCOUNTER — Ambulatory Visit (HOSPITAL_BASED_OUTPATIENT_CLINIC_OR_DEPARTMENT_OTHER)
Admission: RE | Admit: 2017-11-06 | Discharge: 2017-11-06 | Disposition: A | Discharge status: Home / Self Care | Payer: 59 | Source: Ambulatory Visit | Attending: Physician Assistant | Admitting: Physician Assistant

## 2017-11-06 DIAGNOSIS — Z1231 Encounter for screening mammogram for malignant neoplasm of breast: Secondary | ICD-10-CM | POA: Diagnosis not present

## 2017-11-06 DIAGNOSIS — Z1239 Encounter for other screening for malignant neoplasm of breast: Secondary | ICD-10-CM

## 2017-11-09 ENCOUNTER — Other Ambulatory Visit: Payer: Self-pay | Admitting: Physician Assistant

## 2017-11-09 DIAGNOSIS — R928 Other abnormal and inconclusive findings on diagnostic imaging of breast: Secondary | ICD-10-CM

## 2017-11-13 ENCOUNTER — Ambulatory Visit
Admission: RE | Admit: 2017-11-13 | Discharge: 2017-11-13 | Disposition: A | Discharge status: Home / Self Care | Payer: 59 | Source: Ambulatory Visit | Attending: Physician Assistant | Admitting: Physician Assistant

## 2017-11-13 ENCOUNTER — Other Ambulatory Visit: Payer: Self-pay | Admitting: Physician Assistant

## 2017-11-13 DIAGNOSIS — N6320 Unspecified lump in the left breast, unspecified quadrant: Secondary | ICD-10-CM

## 2017-11-13 DIAGNOSIS — R928 Other abnormal and inconclusive findings on diagnostic imaging of breast: Secondary | ICD-10-CM

## 2017-11-23 ENCOUNTER — Ambulatory Visit
Admission: RE | Admit: 2017-11-23 | Discharge: 2017-11-23 | Disposition: A | Discharge status: Home / Self Care | Payer: 59 | Source: Ambulatory Visit | Attending: Physician Assistant | Admitting: Physician Assistant

## 2017-11-23 ENCOUNTER — Other Ambulatory Visit: Payer: Self-pay | Admitting: Physician Assistant

## 2017-11-23 DIAGNOSIS — N6320 Unspecified lump in the left breast, unspecified quadrant: Secondary | ICD-10-CM

## 2019-03-29 ENCOUNTER — Other Ambulatory Visit: Payer: Self-pay

## 2019-03-29 ENCOUNTER — Telehealth (INDEPENDENT_AMBULATORY_CARE_PROVIDER_SITE_OTHER): Payer: 59 | Admitting: Registered Nurse

## 2019-03-29 VITALS — Ht 66.0 in | Wt 265.0 lb

## 2019-03-29 DIAGNOSIS — R0602 Shortness of breath: Secondary | ICD-10-CM

## 2019-03-29 DIAGNOSIS — R0781 Pleurodynia: Secondary | ICD-10-CM | POA: Diagnosis not present

## 2019-03-29 MED ORDER — ALBUTEROL SULFATE HFA 108 (90 BASE) MCG/ACT IN AERS: 2.0000 | INHALATION_SPRAY | Freq: Four times a day (QID) | RESPIRATORY_TRACT | 0 refills | Status: DC | PRN

## 2019-03-29 MED ORDER — PREDNISONE 10 MG PO TABS: ORAL_TABLET | ORAL | 0 refills | Status: AC

## 2019-03-29 MED ORDER — TRAMADOL HCL 50 MG PO TABS: 50.0000 mg | ORAL_TABLET | Freq: Three times a day (TID) | ORAL | 0 refills | Status: AC | PRN

## 2019-03-29 NOTE — Patient Instructions (Signed)
° ° ° °  If you have lab work done today you will be contacted with your lab results within the next 2 weeks.  If you have not heard from us then please contact us. The fastest way to get your results is to register for My Chart. ° ° °IF you received an x-ray today, you will receive an invoice from [facility]. Please contact  Radiology at [phone] with questions or concerns regarding your invoice.  ° °IF you received labwork today, you will receive an invoice from [facility]. Please contact [facility] at [phone] with questions or concerns regarding your invoice.  ° °Our billing staff will not be able to assist you with questions regarding bills from these companies. ° °You will be contacted with the lab results as soon as they are available. The fastest way to get your results is to activate your My Chart account. Instructions are located on the last page of this paperwork. If you have not heard from us regarding the results in 2 weeks, please contact this office. °  ° ° ° °

## 2019-03-29 NOTE — Progress Notes (Signed)
Telemedicine Encounter- SOAP NOTE Established Patient  This telephone encounter was conducted with the patient's (or proxy's) verbal consent via audio telecommunications: ye s  Patient was instructed to have this encounter in a suitably private space; and to only have persons present to whom they give permission to participate. In addition, patient identity was confirmed by use of name plus two identifiers (DOB and address).  I discussed the limitations, risks, security and privacy concerns of performing an evaluation and management service by telephone and the availability of in person appointments. I also discussed with the patient that there may be a patient responsible charge related to this service. The patient expressed understanding and agreed to proceed.  I spent a total of 13 minutes talking with the patient or their proxy.  Chief Complaint  Patient presents with  . Establish Care    tested positive for COVID on 02/23/2019. Still having cough but not as bad as before and now chest she is having chest pain. She doesn't know if it is actually apin or just irritated.    Subjective   [name] [name] is a 49 y.o. established patient. Telephone visit today for ongoing cough and chest pain  HPI Pt had tested positive for COVID on 02/23/19. Overall, very much improved, but still having 3-5 episodes of coughing each day.  Having some R sided lower chest pain. It is reproducible, occurs with coughing, deep breathing, and pressure on the area. Cough is not productive with mucus or pink sputum.  Occ shob, but this is decreasing in frequency. Gets winded easily, but this is also improving No headache, fever, sensory changes, GI symptoms.   Patient Active Problem List   Diagnosis Date Noted  . Left knee injury 01/10/2015    Past Medical History:  Diagnosis Date  . Anxiety   . Cancer ([place]) 1995  . Herpes genitalis in women   . Migraine     Current Outpatient Medications   Medication Sig Dispense Refill  . SUMAtriptan (IMITREX) 100 MG tablet Take 100 mg by mouth every 2 (two) hours as needed.    . valACYclovir (VALTREX) 1000 MG tablet TAKE 1 TABLET BY MOUTH EVERY DAY. NEEDS OFFICE VISIT TO DISCUSS MEDICATION. 30 tablet 1  . zolpidem (AMBIEN) 5 MG tablet Take 5 mg by mouth at bedtime as needed for sleep.    Marland Kitchen albuterol (VENTOLIN HFA) 108 (90 Base) MCG/ACT inhaler Inhale 2 puffs into the lungs every 6 (six) hours as needed for wheezing or shortness of breath. 18 g 0  . methocarbamol (ROBAXIN) 500 MG tablet Take 1 tablet (500 mg total) by mouth 2 (two) times daily. (Patient not taking: Reported on 03/29/2019) 20 tablet 0  . naproxen (NAPROSYN) 500 MG tablet Take 1 tablet (500 mg total) by mouth 2 (two) times daily. (Patient not taking: Reported on 03/29/2019) 30 tablet 0  . oxyCODONE-acetaminophen (PERCOCET/ROXICET) 5-325 MG tablet Take 1 tablet by mouth every 6 (six) hours as needed for severe pain. (Patient not taking: Reported on 04/16/2016) 60 tablet 0  . predniSONE (DELTASONE) 10 MG tablet Take 3 tablets (30 mg total) by mouth daily with breakfast for 3 days, THEN 2 tablets (20 mg total) daily with breakfast for 3 days, THEN 1 tablet (10 mg total) daily with breakfast for 3 days. 18 tablet 0  . traMADol (ULTRAM) 50 MG tablet Take 1 tablet (50 mg total) by mouth every 8 (eight) hours as needed for up to 5 days. 15 tablet 0  No current facility-administered medications for this visit.    No Known Allergies  Social History   Socioeconomic History  . Marital status: Single    Spouse name: Not on file  . Number of children: Not on file  . Years of education: Not on file  . Highest education level: Not on file  Occupational History  . Not on file  Tobacco Use  . Smoking status: Never Smoker  . Smokeless tobacco: Never Used  Substance and Sexual Activity  . Alcohol use: Yes    Alcohol/week: 1.0 standard drinks    Types: 1 Glasses of wine per week  . Drug  use: No  . Sexual activity: Not on file  Other Topics Concern  . Not on file  Social History Narrative  . Not on file   Social Determinants of Health   Financial Resource Strain:   . Difficulty of Paying Living Expenses: Not on file  Food Insecurity:   . Worried About Charity fundraiser in the Last Year: Not on file  . Ran Out of Food in the Last Year: Not on file  Transportation Needs:   . Lack of Transportation (Medical): Not on file  . Lack of Transportation (Non-Medical): Not on file  Physical Activity:   . Days of Exercise per Week: Not on file  . Minutes of Exercise per Session: Not on file  Stress:   . Feeling of Stress : Not on file  Social Connections:   . Frequency of Communication with Friends and Family: Not on file  . Frequency of Social Gatherings with Friends and Family: Not on file  . Attends Religious Services: Not on file  . Active Member of Clubs or Organizations: Not on file  . Attends Archivist Meetings: Not on file  . Marital Status: Not on file  Intimate Partner Violence:   . Fear of Current or Ex-Partner: Not on file  . Emotionally Abused: Not on file  . Physically Abused: Not on file  . Sexually Abused: Not on file    ROS Per hpi   Objective   Vitals as reported by the patient: Today's Vitals   03/29/19 0846  Weight: 265 lb (120.2 kg)  Height: 5\' 6"  (1.676 m)    [name] [name] was seen today for establish care.  Diagnoses and all orders for this visit:  Pleuritic pain -     albuterol (VENTOLIN HFA) 108 (90 Base) MCG/ACT inhaler; Inhale 2 puffs into the lungs every 6 (six) hours as needed for wheezing or shortness of breath. -     predniSONE (DELTASONE) 10 MG tablet; Take 3 tablets (30 mg total) by mouth daily with breakfast for 3 days, THEN 2 tablets (20 mg total) daily with breakfast for 3 days, THEN 1 tablet (10 mg total) daily with breakfast for 3 days. -     traMADol (ULTRAM) 50 MG tablet; Take 1 tablet (50 mg total) by mouth  every 8 (eight) hours as needed for up to 5 days.  Shortness of breath -     albuterol (VENTOLIN HFA) 108 (90 Base) MCG/ACT inhaler; Inhale 2 puffs into the lungs every 6 (six) hours as needed for wheezing or shortness of breath.   PLAN  Given reproducibility of pain, nature and location of pain, and overall symptoms, feel that this is pleuritic pain related to some inflammation rather than CAP or PE.  We discussed that a short course of prednisone, albuterol PRN, and tramadol may help reduce symptoms. She  should continue deep breathing exercises as tolerated  We reviewed sxs of PNA and PE, pt demonstrted understanding  Will have close follow up with appt on Friday to monitor symptoms  Patient encouraged to call clinic with any questions, comments, or concerns.   I discussed the assessment and treatment plan with the patient. The patient was provided an opportunity to ask questions and all were answered. The patient agreed with the plan and demonstrated an understanding of the instructions.   The patient was advised to call back or seek an in-person evaluation if the symptoms worsen or if the condition fails to improve as anticipated.  I provided 13 minutes of non-face-to-face time during this encounter.  Maximiano Coss, NP  Primary Care at [facility]

## 2019-04-01 ENCOUNTER — Other Ambulatory Visit: Payer: Self-pay

## 2019-04-01 ENCOUNTER — Encounter: Payer: Self-pay | Admitting: Registered Nurse

## 2019-04-01 ENCOUNTER — Telehealth (INDEPENDENT_AMBULATORY_CARE_PROVIDER_SITE_OTHER): Payer: 59 | Admitting: Registered Nurse

## 2019-04-01 VITALS — Ht 66.0 in | Wt 264.0 lb

## 2019-04-01 DIAGNOSIS — R0781 Pleurodynia: Secondary | ICD-10-CM

## 2019-04-01 NOTE — Progress Notes (Signed)
Talked   Cleared to to go back 03/07/2019  Covid dx in 02/25/19  Cough still there.  Steroid helped with some relief, but still apparent.

## 2019-04-01 NOTE — Progress Notes (Signed)
Telemedicine Encounter- SOAP NOTE Established Patient  This telephone encounter was conducted with the patient's (or proxy's) verbal consent via audio telecommunications: yes  Patient was instructed to have this encounter in a suitably private space; and to only have persons present to whom they give permission to participate. In addition, patient identity was confirmed by use of name plus two identifiers (DOB and address).  I discussed the limitations, risks, security and privacy concerns of performing an evaluation and management service by telephone and the availability of in person appointments. I also discussed with the patient that there may be a patient responsible charge related to this service. The patient expressed understanding and agreed to proceed.  I spent a total of 12 minutes talking with the patient or their proxy.  No chief complaint on file.   Subjective   [name] [name] is a 49 y.o. established patient. Telephone visit today for follow up for COVID symptoms  HPI Tested positive for COVID-19 on 02/25/19. Cleared to go back to work after 10 day quarantine and improvement of symptoms  However, has noted persistent pain in R lower chest below breast. Sounded as pleuritic pain given its reproducibility and predictability with deep breathing and coughing.   Started her on prednisone burst, tramadol and cough suppressant.  Reports overall improvement today. Pain has lessened. No production with cough. Denies headaches, visual changes, or chest pain. No evidence of cough or ongoing infection.   Patient Active Problem List   Diagnosis Date Noted  . Left knee injury 01/10/2015    Past Medical History:  Diagnosis Date  . Anxiety   . Cancer ([place]) 1995  . Herpes genitalis in women   . Migraine     Current Outpatient Medications  Medication Sig Dispense Refill  . albuterol (VENTOLIN HFA) 108 (90 Base) MCG/ACT inhaler Inhale 2 puffs into the lungs every 6 (six)  hours as needed for wheezing or shortness of breath. 18 g 0  . predniSONE (DELTASONE) 10 MG tablet Take 3 tablets (30 mg total) by mouth daily with breakfast for 3 days, THEN 2 tablets (20 mg total) daily with breakfast for 3 days, THEN 1 tablet (10 mg total) daily with breakfast for 3 days. 18 tablet 0  . SUMAtriptan (IMITREX) 100 MG tablet Take 100 mg by mouth every 2 (two) hours as needed.    . traMADol (ULTRAM) 50 MG tablet Take 1 tablet (50 mg total) by mouth every 8 (eight) hours as needed for up to 5 days. 15 tablet 0  . valACYclovir (VALTREX) 1000 MG tablet TAKE 1 TABLET BY MOUTH EVERY DAY. NEEDS OFFICE VISIT TO DISCUSS MEDICATION. 30 tablet 1  . zolpidem (AMBIEN) 5 MG tablet Take 5 mg by mouth at bedtime as needed for sleep.     No current facility-administered medications for this visit.    No Known Allergies  Social History   Socioeconomic History  . Marital status: Single    Spouse name: Not on file  . Number of children: Not on file  . Years of education: Not on file  . Highest education level: Not on file  Occupational History  . Not on file  Tobacco Use  . Smoking status: Never Smoker  . Smokeless tobacco: Never Used  Substance and Sexual Activity  . Alcohol use: Yes    Alcohol/week: 1.0 standard drinks    Types: 1 Glasses of wine per week  . Drug use: No  . Sexual activity: Not on file  Other  Topics Concern  . Not on file  Social History Narrative  . Not on file   Social Determinants of Health   Financial Resource Strain:   . Difficulty of Paying Living Expenses: Not on file  Food Insecurity:   . Worried About Charity fundraiser in the Last Year: Not on file  . Ran Out of Food in the Last Year: Not on file  Transportation Needs:   . Lack of Transportation (Medical): Not on file  . Lack of Transportation (Non-Medical): Not on file  Physical Activity:   . Days of Exercise per Week: Not on file  . Minutes of Exercise per Session: Not on file  Stress:     . Feeling of Stress : Not on file  Social Connections:   . Frequency of Communication with Friends and Family: Not on file  . Frequency of Social Gatherings with Friends and Family: Not on file  . Attends Religious Services: Not on file  . Active Member of Clubs or Organizations: Not on file  . Attends Archivist Meetings: Not on file  . Marital Status: Not on file  Intimate Partner Violence:   . Fear of Current or Ex-Partner: Not on file  . Emotionally Abused: Not on file  . Physically Abused: Not on file  . Sexually Abused: Not on file    ROS Per  hpi   Objective   Vitals as reported by the patient: Today's Vitals   04/01/19 0836  Weight: 264 lb (119.7 kg)  Height: 5\' 6"  (1.676 m)    Diagnoses and all orders for this visit:  Pleuritic pain   PLAN  Symptoms improved  Continue cough suppressants as needed, tramadol for pain  Otherwise, discussed symptoms of COVID have been noted to last for weeks - months after infection clears  Return to office as needed  Patient encouraged to call clinic with any questions, comments, or concerns.   I discussed the assessment and treatment plan with the patient. The patient was provided an opportunity to ask questions and all were answered. The patient agreed with the plan and demonstrated an understanding of the instructions.   The patient was advised to call back or seek an in-person evaluation if the symptoms worsen or if the condition fails to improve as anticipated.  I provided 12 minutes of non-face-to-face time during this encounter.  Maximiano Coss, NP  Primary Care at [facility]

## 2019-04-01 NOTE — Patient Instructions (Signed)
° ° ° °  If you have lab work done today you will be contacted with your lab results within the next 2 weeks.  If you have not heard from us then please contact us. The fastest way to get your results is to register for My Chart. ° ° °IF you received an x-ray today, you will receive an invoice from [facility]. Please contact [facility] at [phone] with questions or concerns regarding your invoice.  ° °IF you received labwork today, you will receive an invoice from [facility]. Please contact [facility] at [phone] with questions or concerns regarding your invoice.  ° °Our billing staff will not be able to assist you with questions regarding bills from these companies. ° °You will be contacted with the lab results as soon as they are available. The fastest way to get your results is to activate your My Chart account. Instructions are located on the last page of this paperwork. If you have not heard from us regarding the results in 2 weeks, please contact this office. °  ° ° ° °

## 2019-04-15 ENCOUNTER — Other Ambulatory Visit: Payer: Self-pay

## 2019-04-15 ENCOUNTER — Ambulatory Visit: Payer: 59 | Admitting: Registered Nurse

## 2019-04-15 ENCOUNTER — Encounter: Payer: Self-pay | Admitting: Registered Nurse

## 2019-04-15 VITALS — BP 124/84 | HR 87 | Temp 97.6°F | Ht 66.0 in | Wt 272.8 lb

## 2019-04-15 DIAGNOSIS — Z130 Encounter for screening for diseases of the blood and blood-forming organs and certain disorders involving the immune mechanism: Secondary | ICD-10-CM

## 2019-04-15 DIAGNOSIS — Z13228 Encounter for screening for other metabolic disorders: Secondary | ICD-10-CM

## 2019-04-15 DIAGNOSIS — F411 Generalized anxiety disorder: Secondary | ICD-10-CM

## 2019-04-15 DIAGNOSIS — Z1329 Encounter for screening for other suspected endocrine disorder: Secondary | ICD-10-CM

## 2019-04-15 DIAGNOSIS — Z23 Encounter for immunization: Secondary | ICD-10-CM | POA: Diagnosis not present

## 2019-04-15 DIAGNOSIS — Z13220 Encounter for screening for lipoid disorders: Secondary | ICD-10-CM | POA: Diagnosis not present

## 2019-04-15 DIAGNOSIS — Z0001 Encounter for general adult medical examination with abnormal findings: Secondary | ICD-10-CM | POA: Diagnosis not present

## 2019-04-15 MED ORDER — BUSPIRONE HCL 5 MG PO TABS: 5.0000 mg | ORAL_TABLET | Freq: Three times a day (TID) | ORAL | 0 refills | Status: DC

## 2019-04-15 NOTE — Progress Notes (Signed)
Established Patient Office Visit  Subjective:  Patient ID: [name] [name], female    DOB: November 28, 1970  Age: 49 y.o. MRN: [id number]  CC:  Chief Complaint  Patient presents with  . New Patient (Initial Visit)    physcial    HPI [name] [name] presents for CPE  Concerned for anxiety. Has worsened lately. Copes somewhat well, but been more difficult during COVID. Interested in exploring options. Denies HI/SI  Otherwise feels well.   Past Medical History:  Diagnosis Date  . Anxiety   . Cancer ([place]) 1995  . Herpes genitalis in women   . Migraine     Past Surgical History:  Procedure Laterality Date  . ANTERIOR CRUCIATE LIGAMENT REPAIR    . CESAREAN SECTION    . TUBAL LIGATION      Family History  Problem Relation Age of Onset  . Healthy Mother   . Heart disease Father   . Diabetes Father   . Diabetes Maternal Grandmother   . Hyperlipidemia Maternal Grandmother   . Heart disease Paternal Grandmother   . Heart disease Paternal Grandfather   . Hypertension Paternal Grandfather   . Cancer Sister   . Hyperlipidemia Brother     Social History   Socioeconomic History  . Marital status: Single    Spouse name: Not on file  . Number of children: Not on file  . Years of education: Not on file  . Highest education level: Not on file  Occupational History  . Not on file  Tobacco Use  . Smoking status: Never Smoker  . Smokeless tobacco: Never Used  Substance and Sexual Activity  . Alcohol use: Yes    Alcohol/week: 1.0 standard drinks    Types: 1 Glasses of wine per week  . Drug use: No  . Sexual activity: Not on file  Other Topics Concern  . Not on file  Social History Narrative  . Not on file   Social Determinants of Health   Financial Resource Strain:   . Difficulty of Paying Living Expenses: Not on file  Food Insecurity:   . Worried About Charity fundraiser in the Last Year: Not on file  . Ran Out of Food in the Last Year: Not on file    Transportation Needs:   . Lack of Transportation (Medical): Not on file  . Lack of Transportation (Non-Medical): Not on file  Physical Activity:   . Days of Exercise per Week: Not on file  . Minutes of Exercise per Session: Not on file  Stress:   . Feeling of Stress : Not on file  Social Connections:   . Frequency of Communication with Friends and Family: Not on file  . Frequency of Social Gatherings with Friends and Family: Not on file  . Attends Religious Services: Not on file  . Active Member of Clubs or Organizations: Not on file  . Attends Archivist Meetings: Not on file  . Marital Status: Not on file  Intimate Partner Violence:   . Fear of Current or Ex-Partner: Not on file  . Emotionally Abused: Not on file  . Physically Abused: Not on file  . Sexually Abused: Not on file    Outpatient Medications Prior to Visit  Medication Sig Dispense Refill  . albuterol (VENTOLIN HFA) 108 (90 Base) MCG/ACT inhaler Inhale 2 puffs into the lungs every 6 (six) hours as needed for wheezing or shortness of breath. 18 g 0  . SUMAtriptan (IMITREX) 100 MG tablet Take  100 mg by mouth every 2 (two) hours as needed.    . valACYclovir (VALTREX) 1000 MG tablet TAKE 1 TABLET BY MOUTH EVERY DAY. NEEDS OFFICE VISIT TO DISCUSS MEDICATION. 30 tablet 1  . zolpidem (AMBIEN) 5 MG tablet Take 5 mg by mouth at bedtime as needed for sleep.     No facility-administered medications prior to visit.    Not on File  ROS Review of Systems  Constitutional: Negative.   HENT: Negative.   Eyes: Negative.   Respiratory: Negative.   Cardiovascular: Negative.   Gastrointestinal: Negative.   Endocrine: Negative.   Genitourinary: Negative.   Musculoskeletal: Negative.   Skin: Negative.   Allergic/Immunologic: Negative.   Neurological: Negative.   Hematological: Negative.   Psychiatric/Behavioral: Negative for agitation, behavioral problems, confusion, decreased concentration, dysphoric mood,  hallucinations, self-injury, sleep disturbance and suicidal ideas. The patient is nervous/anxious. The patient is not hyperactive.   All other systems reviewed and are negative.     Objective:    Physical Exam  Constitutional: She is oriented to person, place, and time. She appears well-developed and well-nourished. No distress.  HENT:  Head: Normocephalic and atraumatic.  Right Ear: External ear normal.  Left Ear: External ear normal.  Nose: Nose normal.  Mouth/Throat: Oropharynx is clear and moist. No oropharyngeal exudate.  Eyes: Conjunctivae and EOM are normal. Right eye exhibits no discharge. Left eye exhibits no discharge. No scleral icterus.  Neck: No tracheal deviation present. No thyromegaly present.  Cardiovascular: Normal rate, regular rhythm, normal heart sounds and intact distal pulses. Exam reveals no gallop and no friction rub.  No murmur heard. Pulmonary/Chest: Effort normal and breath sounds normal. No respiratory distress. She has no wheezes. She has no rales. She exhibits no tenderness.  Abdominal: Soft. Bowel sounds are normal. She exhibits no distension and no mass. There is no abdominal tenderness. There is no rebound and no guarding.  Musculoskeletal:        General: No tenderness, deformity or edema. Normal range of motion.     Cervical back: Normal range of motion and neck supple.  Lymphadenopathy:    She has no cervical adenopathy.  Neurological: She is alert and oriented to person, place, and time. She exhibits normal muscle tone. Coordination normal.  Skin: Skin is warm and dry. No rash noted. She is not diaphoretic. No erythema. No pallor.  Psychiatric: She has a normal mood and affect. Her behavior is normal. Judgment and thought content normal.  Nursing note and vitals reviewed.   BP 124/84   Pulse 87   Temp 97.6 F (36.4 C) (Temporal)   Ht 5\' 6"  (1.676 m)   Wt 272 lb 12.8 oz (123.7 kg)   LMP 03/26/2019   SpO2 96%   BMI 44.03 kg/m  Wt Readings  from Last 3 Encounters:  04/15/19 272 lb 12.8 oz (123.7 kg)  04/01/19 264 lb (119.7 kg)  03/29/19 265 lb (120.2 kg)     Health Maintenance Due  Topic Date Due  . HIV Screening  05/20/1984  . PAP SMEAR-Modifier  11/18/2014    There are no preventive care reminders to display for this patient.  No results found for: TSH Lab Results  Component Value Date   WBC 6.5 04/16/2016   HGB 13.9 04/16/2016   HCT 40.9 04/16/2016   MCV 92 04/16/2016   PLT 345 04/16/2016   Lab Results  Component Value Date   NA 139 04/16/2016   K 4.4 04/16/2016   CO2 23 04/16/2016  GLUCOSE 94 04/16/2016   BUN 18 04/16/2016   CREATININE 0.97 04/16/2016   BILITOT 0.3 04/16/2016   ALKPHOS 110 04/16/2016   AST 15 04/16/2016   ALT 15 04/16/2016   PROT 7.9 04/16/2016   ALBUMIN 4.8 04/16/2016   CALCIUM 10.3 (H) 04/16/2016   Lab Results  Component Value Date   CHOL 206 (H) 04/16/2016   Lab Results  Component Value Date   HDL 66 04/16/2016   Lab Results  Component Value Date   LDLCALC 129 (H) 04/16/2016   Lab Results  Component Value Date   TRIG 54 04/16/2016   Lab Results  Component Value Date   CHOLHDL 3.1 04/16/2016   No results found for: HGBA1C    Assessment & Plan:   Problem List Items Addressed This Visit    None    Visit Diagnoses    Need for diphtheria-tetanus-pertussis (Tdap) vaccine    -  Primary   Relevant Orders   Tdap vaccine greater than or equal to 7yo IM (Completed)   Anxiety state       Relevant Medications   busPIRone (BUSPAR) 5 MG tablet   Screening for endocrine, metabolic and immunity disorder       Relevant Orders   Comprehensive metabolic panel   CBC   Hemoglobin A1c   TSH   Vitamin D, 25-hydroxy   Lipid screening       Relevant Orders   Lipid Panel      Meds ordered this encounter  Medications  . busPIRone (BUSPAR) 5 MG tablet    Sig: Take 1 tablet (5 mg total) by mouth 3 (three) times daily.    Dispense:  90 tablet    Refill:  0    Order  Specific Question:   Supervising Provider    Answer:   Forrest Moron [id number]    Follow-up: No follow-ups on file.   PLAN  Start buspirone 5mg  PO tid PRN for anxiety  Will consider daily medication or referral if anxiety worsens  Otherwise, no concerning findings on exam  Labs drawn, will follow up with patient as needed  Patient encouraged to call clinic with any questions, comments, or concerns.  Maximiano Coss, NP

## 2019-04-15 NOTE — Patient Instructions (Addendum)
  Ms. Etoy Lattin to meet you in person today. No concerns on your exam. Labs will be back next week. If everything is good, your next follow up will be in 1 year for a physical and labs.   I've sent over a prescription for Buspirone 5mg . You can take this up to 3 times daily as needed for anxiety.  If you have any questions, please reach out.  If you have lab work done today you will be contacted with your lab results within the next 2 weeks.  If you have not heard from [place] then please contact us. The fastest way to get your results is to register for My Chart.   IF you received an x-ray today, you will receive an invoice from [facility]. Please contact [facility] at [phone] with questions or concerns regarding your invoice.   IF you received labwork today, you will receive an invoice from [facility]. Please contact [facility] at [phone] with questions or concerns regarding your invoice.   Our billing staff will not be able to assist you with questions regarding bills from these companies.  You will be contacted with the lab results as soon as they are available. The fastest way to get your results is to activate your My Chart account. Instructions are located on the last page of this paperwork. If you have not heard from [place] regarding the results in 2 weeks, please contact this office.

## 2019-04-16 LAB — CBC
Hematocrit: 34.8 % (ref 34.0–46.6)
Hemoglobin: 11.6 g/dL (ref 11.1–15.9)
MCH: 31.4 pg (ref 26.6–33.0)
MCHC: 33.3 g/dL (ref 31.5–35.7)
MCV: 94 fL (ref 79–97)
Platelets: 340 10*3/uL (ref 150–450)
RBC: 3.7 x10E6/uL — ABNORMAL LOW (ref 3.77–5.28)
RDW: 12.4 % (ref 11.7–15.4)
WBC: 7.3 10*3/uL (ref 3.4–10.8)

## 2019-04-16 LAB — COMPREHENSIVE METABOLIC PANEL
ALT: 32 IU/L (ref 0–32)
AST: 18 IU/L (ref 0–40)
Albumin/Globulin Ratio: 1.6 (ref 1.2–2.2)
Albumin: 4.4 g/dL (ref 3.8–4.8)
Alkaline Phosphatase: 106 IU/L (ref 39–117)
BUN/Creatinine Ratio: 18 (ref 9–23)
BUN: 15 mg/dL (ref 6–24)
Bilirubin Total: <0.2 mg/dL (ref 0.0–1.2)
CO2: 25 mmol/L (ref 20–29)
Calcium: 9.7 mg/dL (ref 8.7–10.2)
Chloride: 103 mmol/L (ref 96–106)
Creatinine, Ser: 0.84 mg/dL (ref 0.57–1.00)
GFR calc Af Amer: 94 mL/min/{1.73_m2} (ref >59)
GFR calc non Af Amer: 82 mL/min/{1.73_m2} (ref >59)
Globulin, Total: 2.8 g/dL (ref 1.5–4.5)
Glucose: 88 mg/dL (ref 65–99)
Potassium: 4.4 mmol/L (ref 3.5–5.2)
Sodium: 139 mmol/L (ref 134–144)
Total Protein: 7.2 g/dL (ref 6.0–8.5)

## 2019-04-16 LAB — VITAMIN D 25 HYDROXY (VIT D DEFICIENCY, FRACTURES): Vit D, 25-Hydroxy: 19.8 ng/mL — ABNORMAL LOW (ref 30.0–100.0)

## 2019-04-16 LAB — LIPID PANEL
Chol/HDL Ratio: 3.5 ratio (ref 0.0–4.4)
Cholesterol, Total: 188 mg/dL (ref 100–199)
HDL: 53 mg/dL (ref >39)
LDL Chol Calc (NIH): 117 mg/dL — ABNORMAL HIGH (ref 0–99)
Triglycerides: 97 mg/dL (ref 0–149)
VLDL Cholesterol Cal: 18 mg/dL (ref 5–40)

## 2019-04-16 LAB — HEMOGLOBIN A1C
Est. average glucose Bld gHb Est-mCnc: 123 mg/dL
Hgb A1c MFr Bld: 5.9 % — ABNORMAL HIGH (ref 4.8–5.6)

## 2019-04-16 LAB — TSH: TSH: 1.19 u[IU]/mL (ref 0.450–4.500)

## 2019-04-18 ENCOUNTER — Encounter: Payer: Self-pay | Admitting: Registered Nurse

## 2019-04-18 DIAGNOSIS — E559 Vitamin D deficiency, unspecified: Secondary | ICD-10-CM

## 2019-04-18 MED ORDER — VITAMIN D (ERGOCALCIFEROL) 1.25 MG (50000 UNIT) PO CAPS: 50000.0000 [IU] | ORAL_CAPSULE | ORAL | 0 refills | Status: DC

## 2019-04-18 NOTE — Progress Notes (Signed)
Lipids mildly elevated - lifestyle modification Vitamin D deficiency - supplement with 50,000 units weekly for six weeks, then daily OTC Letter sent to pt via [place], NP

## 2019-05-08 ENCOUNTER — Other Ambulatory Visit: Payer: Self-pay | Admitting: Registered Nurse

## 2019-05-08 DIAGNOSIS — E559 Vitamin D deficiency, unspecified: Secondary | ICD-10-CM

## 2019-05-10 ENCOUNTER — Other Ambulatory Visit: Payer: Self-pay | Admitting: Registered Nurse

## 2019-05-10 DIAGNOSIS — F411 Generalized anxiety disorder: Secondary | ICD-10-CM

## 2019-05-10 NOTE — Telephone Encounter (Signed)
Patient is requesting a refill of the following medications: Requested Prescriptions   Pending Prescriptions Disp Refills  . busPIRone (BUSPAR) 5 MG tablet [Pharmacy Med Name: BUSPIRONE HCL 5 MG TABLET] 270 tablet 1    Sig: TAKE 1 TABLET BY MOUTH THREE TIMES A DAY

## 2019-05-13 ENCOUNTER — Telehealth: Payer: Self-pay | Admitting: Registered Nurse

## 2019-05-13 NOTE — Telephone Encounter (Signed)
Patient wants to know if she needs to continue to take her Vitamin D? She doesn't mind taking it ..just wondering if d=she needs to make a follow up any time soon to check her vitamin D levels

## 2019-05-14 NOTE — Telephone Encounter (Signed)
Ideally it would be great for her to continue an over the counter supplement daily. We can recheck her levels in 6-12 mos. She's not low enough that I'm too concerned, I just wouldn't want her to slide much lower.   Thank you  Kathrin Ruddy, NP

## 2019-05-16 ENCOUNTER — Encounter: Payer: Self-pay | Admitting: Registered Nurse

## 2019-05-16 NOTE — Telephone Encounter (Signed)
Spoke with patient and told her to continue taking her vitamin D and she wants to know when you would like to see her again for a recheck.

## 2019-06-02 ENCOUNTER — Other Ambulatory Visit: Payer: Self-pay | Admitting: Registered Nurse

## 2019-06-02 DIAGNOSIS — E559 Vitamin D deficiency, unspecified: Secondary | ICD-10-CM

## 2019-06-14 ENCOUNTER — Other Ambulatory Visit: Payer: Self-pay | Admitting: Registered Nurse

## 2019-06-14 ENCOUNTER — Encounter: Payer: Self-pay | Admitting: Registered Nurse

## 2019-06-14 DIAGNOSIS — F411 Generalized anxiety disorder: Secondary | ICD-10-CM

## 2019-06-14 MED ORDER — ZOLPIDEM TARTRATE 5 MG PO TABS: 5.0000 mg | ORAL_TABLET | Freq: Every evening | ORAL | 0 refills | Status: DC | PRN

## 2019-06-14 NOTE — Telephone Encounter (Signed)
Patient is requesting a refill of the following medications: Ambien 5 mg  Requested Prescriptions    No prescriptions requested or ordered in this encounter    Date of patient request: 06/14/2019 Last office visit: 04/15/2019 Date of last refill: inknonw Last refill amount: unknown Follow up time period per chart: 1 year

## 2019-06-30 ENCOUNTER — Other Ambulatory Visit: Payer: Self-pay | Admitting: Registered Nurse

## 2019-06-30 DIAGNOSIS — E559 Vitamin D deficiency, unspecified: Secondary | ICD-10-CM

## 2019-07-06 ENCOUNTER — Other Ambulatory Visit: Payer: Self-pay | Admitting: Registered Nurse

## 2019-07-06 DIAGNOSIS — F411 Generalized anxiety disorder: Secondary | ICD-10-CM

## 2019-07-26 ENCOUNTER — Other Ambulatory Visit: Payer: Self-pay | Admitting: Registered Nurse

## 2019-07-26 DIAGNOSIS — E559 Vitamin D deficiency, unspecified: Secondary | ICD-10-CM

## 2019-07-26 NOTE — Telephone Encounter (Signed)
Should patient continue on the high dose vitamin D or switch to a lower dose OTC?

## 2019-07-26 NOTE — Telephone Encounter (Signed)
Lower dose OTC until we can confirm levels are still low - she can come in for repeat testing any time that is convenient for her.  Thank you  Kathrin Ruddy, NP

## 2019-07-30 ENCOUNTER — Other Ambulatory Visit: Payer: Self-pay | Admitting: Registered Nurse

## 2019-07-30 DIAGNOSIS — F411 Generalized anxiety disorder: Secondary | ICD-10-CM

## 2019-08-12 ENCOUNTER — Ambulatory Visit: Payer: 59 | Admitting: Registered Nurse

## 2019-08-12 ENCOUNTER — Encounter: Payer: Self-pay | Admitting: Registered Nurse

## 2019-08-12 ENCOUNTER — Other Ambulatory Visit: Payer: Self-pay

## 2019-08-12 VITALS — BP 121/76 | HR 78 | Temp 98.2°F | Resp 17 | Ht 66.0 in | Wt 267.6 lb

## 2019-08-12 DIAGNOSIS — M25572 Pain in left ankle and joints of left foot: Secondary | ICD-10-CM | POA: Diagnosis not present

## 2019-08-12 DIAGNOSIS — F411 Generalized anxiety disorder: Secondary | ICD-10-CM | POA: Diagnosis not present

## 2019-08-12 DIAGNOSIS — Z1211 Encounter for screening for malignant neoplasm of colon: Secondary | ICD-10-CM

## 2019-08-12 DIAGNOSIS — E559 Vitamin D deficiency, unspecified: Secondary | ICD-10-CM

## 2019-08-12 MED ORDER — MELOXICAM 15 MG PO TABS: 15.0000 mg | ORAL_TABLET | Freq: Every day | ORAL | 0 refills | Status: DC

## 2019-08-12 NOTE — Patient Instructions (Signed)
° ° ° °  If you have lab work done today you will be contacted with your lab results within the next 2 weeks.  If you have not heard from us then please contact us. The fastest way to get your results is to register for My Chart. ° ° °IF you received an x-ray today, you will receive an invoice from [facility]. Please contact [facility] at [phone] with questions or concerns regarding your invoice.  ° °IF you received labwork today, you will receive an invoice from [facility]. Please contact [facility] at [phone] with questions or concerns regarding your invoice.  ° °Our billing staff will not be able to assist you with questions regarding bills from these companies. ° °You will be contacted with the lab results as soon as they are available. The fastest way to get your results is to activate your My Chart account. Instructions are located on the last page of this paperwork. If you have not heard from us regarding the results in 2 weeks, please contact this office. °  ° ° ° °

## 2019-08-13 LAB — CBC
Hematocrit: 39.6 % (ref 34.0–46.6)
Hemoglobin: 13 g/dL (ref 11.1–15.9)
MCH: 30.2 pg (ref 26.6–33.0)
MCHC: 32.8 g/dL (ref 31.5–35.7)
MCV: 92 fL (ref 79–97)
Platelets: 315 10*3/uL (ref 150–450)
RBC: 4.3 x10E6/uL (ref 3.77–5.28)
RDW: 13 % (ref 11.7–15.4)
WBC: 6.3 10*3/uL (ref 3.4–10.8)

## 2019-08-13 LAB — VITAMIN D 25 HYDROXY (VIT D DEFICIENCY, FRACTURES): Vit D, 25-Hydroxy: 42.8 ng/mL (ref 30.0–100.0)

## 2019-08-13 LAB — URIC ACID: Uric Acid: 6.1 mg/dL (ref 2.6–6.2)

## 2019-08-16 ENCOUNTER — Encounter: Payer: Self-pay | Admitting: Gastroenterology

## 2019-08-17 ENCOUNTER — Encounter: Payer: Self-pay | Admitting: Registered Nurse

## 2019-08-17 MED ORDER — ZOLPIDEM TARTRATE 10 MG PO TABS: 10.0000 mg | ORAL_TABLET | Freq: Every evening | ORAL | 0 refills | Status: DC | PRN

## 2019-08-17 NOTE — Progress Notes (Signed)
Established Patient Office Visit  Subjective:  Patient ID: [name] [name], female    DOB: 1971/02/22  Age: 49 y.o. MRN: [id number]  CC:  Chief Complaint  Patient presents with  . Foot Swelling    patient states that a few years ago she jumped off of a wall at a foot ball game and orginally injured both knees and had to get knee replacement and acl. She thinks this is now making her feet swell with throbbing pain. Per patient she takes ibuprofen but it does not seem to help and left foot and ankle are the worse painful parts  . Medication Refill    patient would like to discuss ambien    HPI [name] [name] presents for foot swelling.  L ankle. Injured originally 4-5 years ago, was jumping down short distance and injured both knees and ankles. Needed ACL reconstruction and other surgical repair of L knee.  Now is having L ankle pain, tenderness. No recent acute injury. Taking ibuprofen with limited relief. No bruising, redness, warmth. Full ROM.  No radiation of pain. Can bear weight. No recent changes to activity.  Past Medical History:  Diagnosis Date  . Anxiety   . Cancer ([place]) 1995  . Herpes genitalis in women   . Migraine     Past Surgical History:  Procedure Laterality Date  . ANTERIOR CRUCIATE LIGAMENT REPAIR    . CESAREAN SECTION    . TUBAL LIGATION      Family History  Problem Relation Age of Onset  . Healthy Mother   . Heart disease Father   . Diabetes Father   . Diabetes Maternal Grandmother   . Hyperlipidemia Maternal Grandmother   . Heart disease Paternal Grandmother   . Heart disease Paternal Grandfather   . Hypertension Paternal Grandfather   . Cancer Sister   . Hyperlipidemia Brother     Social History   Socioeconomic History  . Marital status: Single    Spouse name: Not on file  . Number of children: Not on file  . Years of education: Not on file  . Highest education level: Not on file  Occupational History  . Not on file  Tobacco Use    . Smoking status: Never Smoker  . Smokeless tobacco: Never Used  Substance and Sexual Activity  . Alcohol use: Yes    Alcohol/week: 1.0 standard drinks    Types: 1 Glasses of wine per week  . Drug use: No  . Sexual activity: Not on file  Other Topics Concern  . Not on file  Social History Narrative  . Not on file   Social Determinants of Health   Financial Resource Strain:   . Difficulty of Paying Living Expenses:   Food Insecurity:   . Worried About Charity fundraiser in the Last Year:   . Arboriculturist in the Last Year:   Transportation Needs:   . Film/video editor (Medical):   Marland Kitchen Lack of Transportation (Non-Medical):   Physical Activity:   . Days of Exercise per Week:   . Minutes of Exercise per Session:   Stress:   . Feeling of Stress :   Social Connections:   . Frequency of Communication with Friends and Family:   . Frequency of Social Gatherings with Friends and Family:   . Attends Religious Services:   . Active Member of Clubs or Organizations:   . Attends Archivist Meetings:   Marland Kitchen Marital Status:   Intimate  Partner Violence:   . Fear of Current or Ex-Partner:   . Emotionally Abused:   Marland Kitchen Physically Abused:   . Sexually Abused:     Outpatient Medications Prior to Visit  Medication Sig Dispense Refill  . albuterol (VENTOLIN HFA) 108 (90 Base) MCG/ACT inhaler Inhale 2 puffs into the lungs every 6 (six) hours as needed for wheezing or shortness of breath. 18 g 0  . busPIRone (BUSPAR) 5 MG tablet TAKE 1 TABLET BY MOUTH THREE TIMES A DAY 270 tablet 2  . SUMAtriptan (IMITREX) 100 MG tablet Take 100 mg by mouth every 2 (two) hours as needed.    . valACYclovir (VALTREX) 1000 MG tablet TAKE 1 TABLET BY MOUTH EVERY DAY. NEEDS OFFICE VISIT TO DISCUSS MEDICATION. 30 tablet 1  . Vitamin D, Ergocalciferol, (DRISDOL) 1.25 MG (50000 UNIT) CAPS capsule TAKE 1 CAPSULE (50,000 UNITS TOTAL) BY MOUTH EVERY 7 (SEVEN) DAYS. 4 capsule 1  . zolpidem (AMBIEN) 5 MG  tablet Take 1 tablet (5 mg total) by mouth at bedtime as needed for sleep. 30 tablet 0   No facility-administered medications prior to visit.    No Known Allergies  ROS Review of Systems  Constitutional: Negative.   HENT: Negative.   Eyes: Negative.   Respiratory: Negative.   Cardiovascular: Negative.   Gastrointestinal: Negative.   Endocrine: Negative.   Genitourinary: Negative.   Musculoskeletal: Positive for arthralgias. Negative for back pain, gait problem, joint swelling, myalgias, neck pain and neck stiffness.  Skin: Negative.   Allergic/Immunologic: Negative.   Neurological: Negative.   Hematological: Negative.   Psychiatric/Behavioral: Negative.   All other systems reviewed and are negative.     Objective:    Physical Exam  Constitutional: She is oriented to person, place, and time. She appears well-developed and well-nourished. No distress.  Cardiovascular: Normal rate and regular rhythm.  Pulmonary/Chest: Effort normal. No respiratory distress.  Musculoskeletal:        General: Tenderness (medial and lateral malleoli) and edema (l ankle) present. No deformity. Normal range of motion.  Neurological: She is alert and oriented to person, place, and time. No cranial nerve deficit.  Skin: Skin is warm and dry. No rash noted. She is not diaphoretic. No erythema. No pallor.  Psychiatric: She has a normal mood and affect. Her behavior is normal. Judgment and thought content normal.  Nursing note and vitals reviewed.   BP 121/76   Pulse 78   Temp 98.2 F (36.8 C) (Temporal)   Resp 17   Ht 5\' 6"  (1.676 m)   Wt 267 lb 9.6 oz (121.4 kg)   LMP 07/04/2019   SpO2 96%   BMI 43.19 kg/m  Wt Readings from Last 3 Encounters:  08/12/19 267 lb 9.6 oz (121.4 kg)  04/15/19 272 lb 12.8 oz (123.7 kg)  04/01/19 264 lb (119.7 kg)     Health Maintenance Due  Topic Date Due  . PAP SMEAR-Modifier  11/18/2014  . COLONOSCOPY  Never done    There are no preventive care  reminders to display for this patient.  Lab Results  Component Value Date   TSH 1.190 04/15/2019   Lab Results  Component Value Date   WBC 6.3 08/12/2019   HGB 13.0 08/12/2019   HCT 39.6 08/12/2019   MCV 92 08/12/2019   PLT 315 08/12/2019   Lab Results  Component Value Date   NA 139 04/15/2019   K 4.4 04/15/2019   CO2 25 04/15/2019   GLUCOSE 88 04/15/2019   BUN 15  04/15/2019   CREATININE 0.84 04/15/2019   BILITOT <0.2 04/15/2019   ALKPHOS 106 04/15/2019   AST 18 04/15/2019   ALT 32 04/15/2019   PROT 7.2 04/15/2019   ALBUMIN 4.4 04/15/2019   CALCIUM 9.7 04/15/2019   Lab Results  Component Value Date   CHOL 188 04/15/2019   Lab Results  Component Value Date   HDL 53 04/15/2019   Lab Results  Component Value Date   LDLCALC 117 (H) 04/15/2019   Lab Results  Component Value Date   TRIG 97 04/15/2019   Lab Results  Component Value Date   CHOLHDL 3.5 04/15/2019   Lab Results  Component Value Date   HGBA1C 5.9 (H) 04/15/2019      Assessment & Plan:   Problem List Items Addressed This Visit    None    Visit Diagnoses    Left ankle pain, unspecified chronicity    -  Primary   Relevant Medications   meloxicam (MOBIC) 15 MG tablet   Other Relevant Orders   Uric Acid (Completed)   CBC (Completed)   DG Ankle Complete Left   Vitamin D deficiency       Relevant Orders   VITAMIN D 25 Hydroxy (Vit-D Deficiency, Fractures) (Completed)   Special screening for malignant neoplasms, colon       Relevant Orders   Ambulatory referral to Gastroenterology   Anxiety state       Relevant Medications   zolpidem (AMBIEN) 10 MG tablet      Meds ordered this encounter  Medications  . meloxicam (MOBIC) 15 MG tablet    Sig: Take 1 tablet (15 mg total) by mouth daily.    Dispense:  30 tablet    Refill:  0    Order Specific Question:   Supervising Provider    Answer:   Delia Chimes A [id number]  . zolpidem (AMBIEN) 10 MG tablet    Sig: Take 1 tablet (10 mg total)  by mouth at bedtime as needed for sleep.    Dispense:  30 tablet    Refill:  0    Order Specific Question:   Supervising Provider    Answer:   Carlota Raspberry, JEFFREY R [[id number]    Follow-up: No follow-ups on file.   PLAN  Discussed RICE.   Do not feel there is acute injury, likely favoring joint based on past injury  Given course of meloxicam  If no improvement will consider imaging and PT  Patient encouraged to call clinic with any questions, comments, or concerns.  Maximiano Coss, NP

## 2019-08-19 ENCOUNTER — Encounter: Payer: Self-pay | Admitting: Registered Nurse

## 2019-08-19 NOTE — Telephone Encounter (Signed)
Pt requesting lab results I do see they are back, any comments?

## 2019-08-22 ENCOUNTER — Telehealth: Payer: Self-pay

## 2019-08-22 NOTE — Telephone Encounter (Signed)
I have already spoken to this pt and informed her she is to come in for an Xray of her ankle and then we will order a referral if needed. Pt also know she may come at her convenience to get XR but is aware of techs break time

## 2019-08-24 ENCOUNTER — Encounter: Payer: Self-pay | Admitting: Registered Nurse

## 2019-08-24 ENCOUNTER — Other Ambulatory Visit: Payer: Self-pay

## 2019-08-24 ENCOUNTER — Ambulatory Visit (INDEPENDENT_AMBULATORY_CARE_PROVIDER_SITE_OTHER): Payer: 59

## 2019-08-24 ENCOUNTER — Ambulatory Visit: Payer: 59 | Admitting: Registered Nurse

## 2019-08-24 ENCOUNTER — Ambulatory Visit: Payer: 59

## 2019-08-24 DIAGNOSIS — M25572 Pain in left ankle and joints of left foot: Secondary | ICD-10-CM

## 2019-08-24 NOTE — Progress Notes (Signed)
Pt presents for DG ankle left - Xray was not in last time she was in office.

## 2019-08-24 NOTE — Progress Notes (Signed)
Call Please:  Xray normal. Will refer to ortho.  Thank you,  Kathrin Ruddy, NP

## 2019-08-26 NOTE — Progress Notes (Signed)
Called pt and informed her of this. Ortho Appointment is for 09/01/19. We will see her afterwards.

## 2019-09-01 ENCOUNTER — Other Ambulatory Visit: Payer: Self-pay

## 2019-09-01 ENCOUNTER — Ambulatory Visit: Payer: 59 | Admitting: Orthopedic Surgery

## 2019-09-01 ENCOUNTER — Encounter: Payer: Self-pay | Admitting: Orthopedic Surgery

## 2019-09-01 VITALS — Ht 66.0 in | Wt 267.6 lb

## 2019-09-01 DIAGNOSIS — Z6841 Body Mass Index (BMI) 40.0 and over, adult: Secondary | ICD-10-CM

## 2019-09-01 DIAGNOSIS — M76822 Posterior tibial tendinitis, left leg: Secondary | ICD-10-CM

## 2019-09-02 ENCOUNTER — Encounter: Payer: Self-pay | Admitting: Orthopedic Surgery

## 2019-09-02 NOTE — Progress Notes (Signed)
Office Visit Note   Patient: [name] [name]           Date of Birth: 10-09-1970           MRN: [id number] Visit Date: 09/01/2019              Requested by: Maximiano Coss, NP [place],  [place] [address] PCP: Maximiano Coss, NP  Chief Complaint  Patient presents with  . Left Ankle - Pain, New Patient (Initial Visit)      HPI: Patient is a 49 year old woman who is seen for initial evaluation for pain and swelling over the posterior tibial tendon on the left foot.  She states she has had increasing pain over the past month with initial symptoms starting about 6 months ago.  Patient also states that she had a meniscal injury several years ago that underwent surgery and she is not sure if this has healed.  Assessment & Plan: Visit Diagnoses:  1. Posterior tibial tendinitis, left leg     Plan: Patient is given a posterior tibial tendon brace to support the posterior tibial tendon left recommended a stiff soled sneaker to further unload the midfoot.  Recommended quad strengthening for her knee.  She will use her prescription for meloxicam as needed.  Discussed that patient may need a talonavicular and subtalar fusion if the tendon will not resolve on its own with conservative offloading.  Follow-Up Instructions: Return in about 4 weeks (around 09/29/2019).   Ortho Exam  Patient is alert, oriented, no adenopathy, well-dressed, normal affect, normal respiratory effort. Examination patient has valgus of the hindfoot on the left with a positive too many toe sign.  Patient can do a single limb heel raise on the right cannot do a single limb heel raise on the left she is tender to palpation and has swelling over the posterior tibial tendon.  Imaging: No results found. No images are attached to the encounter.  Labs: Lab Results  Component Value Date   HGBA1C 5.9 (H) 04/15/2019   LABURIC 6.1 08/12/2019     Lab Results  Component Value Date   ALBUMIN 4.4 04/15/2019    ALBUMIN 4.8 04/16/2016   LABURIC 6.1 08/12/2019    No results found for: MG Lab Results  Component Value Date   VD25OH 42.8 08/12/2019   VD25OH 19.8 (L) 04/15/2019    No results found for: PREALBUMIN CBC EXTENDED Latest Ref Rng & Units 08/12/2019 04/15/2019 04/16/2016  WBC 3.4 - 10.8 x10E3/uL 6.3 7.3 6.5  RBC 3.77 - 5.28 x10E6/uL 4.30 3.70(L) 4.45  HGB 11.1 - 15.9 g/dL 13.0 11.6 13.9  HCT 34.0 - 46.6 % 39.6 34.8 40.9  PLT 150 - 450 x10E3/uL 315 340 345  NEUTROABS 1 - 7 x10E3/uL - - 4.1  LYMPHSABS 0 - 3 x10E3/uL - - 2.0     Body mass index is 43.19 kg/m.  Orders:  No orders of the defined types were placed in this encounter.  No orders of the defined types were placed in this encounter.    Procedures: No procedures performed  Clinical Data: No additional findings.  ROS:  All other systems negative, except as noted in the HPI. Review of Systems  Objective: Vital Signs: Ht 5\' 6"  (1.676 m)   Wt 267 lb 9.6 oz (121.4 kg)   BMI 43.19 kg/m   Specialty Comments:  No specialty comments available.  PMFS History: Patient Active Problem List   Diagnosis Date Noted  . Left knee injury 01/10/2015  Past Medical History:  Diagnosis Date  . Anxiety   . Cancer ([place]) 1995  . Herpes genitalis in women   . Migraine     Family History  Problem Relation Age of Onset  . Healthy Mother   . Heart disease Father   . Diabetes Father   . Diabetes Maternal Grandmother   . Hyperlipidemia Maternal Grandmother   . Heart disease Paternal Grandmother   . Heart disease Paternal Grandfather   . Hypertension Paternal Grandfather   . Cancer Sister   . Hyperlipidemia Brother     Past Surgical History:  Procedure Laterality Date  . ANTERIOR CRUCIATE LIGAMENT REPAIR    . CESAREAN SECTION    . TUBAL LIGATION     Social History   Occupational History  . Not on file  Tobacco Use  . Smoking status: Never Smoker  . Smokeless tobacco: Never Used  Substance and Sexual Activity    . Alcohol use: Yes    Alcohol/week: 1.0 standard drink    Types: 1 Glasses of wine per week  . Drug use: No  . Sexual activity: Not on file

## 2019-09-05 ENCOUNTER — Encounter: Payer: Self-pay | Admitting: Orthopedic Surgery

## 2019-09-05 ENCOUNTER — Telehealth: Payer: Self-pay

## 2019-09-05 NOTE — Telephone Encounter (Signed)
Patient called wanting to ask questions regarding her brace.

## 2019-09-06 ENCOUNTER — Ambulatory Visit: Payer: 59 | Admitting: Orthopedic Surgery

## 2019-09-06 ENCOUNTER — Ambulatory Visit (INDEPENDENT_AMBULATORY_CARE_PROVIDER_SITE_OTHER): Payer: 59 | Admitting: Orthopedic Surgery

## 2019-09-06 ENCOUNTER — Encounter: Payer: Self-pay | Admitting: Orthopedic Surgery

## 2019-09-06 ENCOUNTER — Other Ambulatory Visit: Payer: Self-pay

## 2019-09-06 DIAGNOSIS — M76822 Posterior tibial tendinitis, left leg: Secondary | ICD-10-CM

## 2019-09-06 NOTE — Progress Notes (Signed)
Given new CAM walker, nurse only visit.

## 2019-09-06 NOTE — Telephone Encounter (Signed)
Message was sent in response to pt question in my chart. Advised can come in and can place pt in fx boot. To call and make an appt for this any day and time we can do a nurse only

## 2019-09-14 ENCOUNTER — Other Ambulatory Visit: Payer: Self-pay | Admitting: Registered Nurse

## 2019-09-14 ENCOUNTER — Other Ambulatory Visit: Payer: Self-pay | Admitting: *Deleted

## 2019-09-14 DIAGNOSIS — M25572 Pain in left ankle and joints of left foot: Secondary | ICD-10-CM

## 2019-09-29 ENCOUNTER — Encounter: Payer: Self-pay | Admitting: Orthopedic Surgery

## 2019-09-29 ENCOUNTER — Ambulatory Visit: Payer: 59 | Admitting: Orthopedic Surgery

## 2019-09-29 ENCOUNTER — Other Ambulatory Visit: Payer: Self-pay

## 2019-09-29 VITALS — Ht 66.0 in | Wt 267.6 lb

## 2019-09-29 DIAGNOSIS — M76822 Posterior tibial tendinitis, left leg: Secondary | ICD-10-CM | POA: Diagnosis not present

## 2019-10-06 ENCOUNTER — Other Ambulatory Visit: Payer: Self-pay | Admitting: Registered Nurse

## 2019-10-06 ENCOUNTER — Encounter: Payer: Self-pay | Admitting: Orthopedic Surgery

## 2019-10-06 DIAGNOSIS — F411 Generalized anxiety disorder: Secondary | ICD-10-CM

## 2019-10-06 NOTE — Telephone Encounter (Signed)
Patient is requesting a refill of the following medications: Requested Prescriptions   Pending Prescriptions Disp Refills   zolpidem (AMBIEN) 10 MG tablet [Pharmacy Med Name: ZOLPIDEM TARTRATE 10 MG TABLET] 30 tablet 0    Sig: TAKE 1 TABLET BY MOUTH AT BEDTIME AS NEEDED FOR SLEEP.    Date of patient request: 10/06/2019 Last office visit: 08/12/2019 Date of last refill: 08/17/2019 Last refill amount: 30

## 2019-10-07 ENCOUNTER — Other Ambulatory Visit: Payer: Self-pay | Admitting: Physician Assistant

## 2019-10-07 DIAGNOSIS — M25572 Pain in left ankle and joints of left foot: Secondary | ICD-10-CM

## 2019-10-07 MED ORDER — MELOXICAM 15 MG PO TABS: 15.0000 mg | ORAL_TABLET | Freq: Every day | ORAL | 0 refills | Status: DC

## 2019-10-11 ENCOUNTER — Encounter: Payer: Self-pay | Admitting: Orthopedic Surgery

## 2019-10-11 NOTE — Progress Notes (Signed)
Office Visit Note   Patient: [name] [name]           Date of Birth: 1970-12-26           MRN: [id number] Visit Date: 09/29/2019              Requested by: Maximiano Coss, NP [place],  [place] [address] PCP: Maximiano Coss, NP  Chief Complaint  Patient presents with  . 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  She states she is taking meloxicam weightbearing with a fracture boot she states she has had decreased swelling but still has pain.  Assessment & Plan: Visit Diagnoses:  1. Posterior tibial tendinitis, left leg     Plan: Due to failure of conservative treatment patient states she would like to consider operative treatment we reviewed talonavicular and subtalar fusion as an outpatient surgery risks and benefits of surgery were discussed.  Patient states she will call us when she is ready to proceed with surgery.  Discussed that she would need to be off her foot for about 2 months after the fusion.  Follow-Up Instructions: Return if symptoms worsen or fail to improve.   Ortho Exam  Patient is alert, oriented, no adenopathy, well-dressed, normal affect, normal respiratory effort. Examination patient has pain to palpation over the posterior tibial tendon as well as pain to palpation of the sinus Tarsi due to impingement with pronation and valgus of the foot.  She does have good ankle and subtalar motion.  She has good pulses.  She could not do a single limb heel raise.  Imaging: No results found. No images are attached to the encounter.  Labs: Lab Results  Component Value Date   HGBA1C 5.9 (H) 04/15/2019   LABURIC 6.1 08/12/2019     Lab Results  Component Value Date   ALBUMIN 4.4 04/15/2019   ALBUMIN 4.8 04/16/2016   LABURIC 6.1 08/12/2019    No results found for: MG Lab Results  Component Value Date   VD25OH 42.8 08/12/2019    VD25OH 19.8 (L) 04/15/2019    No results found for: PREALBUMIN CBC EXTENDED Latest Ref Rng & Units 08/12/2019 04/15/2019 04/16/2016  WBC 3.4 - 10.8 x10E3/uL 6.3 7.3 6.5  RBC 3.77 - 5.28 x10E6/uL 4.30 3.70(L) 4.45  HGB 11.1 - 15.9 g/dL 13.0 11.6 13.9  HCT 34.0 - 46.6 % 39.6 34.8 40.9  PLT 150 - 450 x10E3/uL 315 340 345  NEUTROABS 1 - 7 x10E3/uL - - 4.1  LYMPHSABS 0 - 3 x10E3/uL - - 2.0     Body mass index is 43.19 kg/m.  Orders:  No orders of the defined types were placed in this encounter.  No orders of the defined types were placed in this encounter.    Procedures: No procedures performed  Clinical Data: No additional findings.  ROS:  All other systems negative, except as noted in the HPI. Review of Systems  Objective: Vital Signs: Ht 5\' 6"  (1.676 m)   Wt 267 lb 9.6 oz (121.4 kg)   BMI 43.19 kg/m   Specialty Comments:  No specialty comments available.  PMFS History: Patient Active Problem List   Diagnosis Date Noted  . Left knee injury 01/10/2015   Past Medical History:  Diagnosis Date  . Anxiety   . Cancer ([place]) 1995  . Herpes genitalis in women   . Migraine  Family History  Problem Relation Age of Onset  . Healthy Mother   . Heart disease Father   . Diabetes Father   . Diabetes Maternal Grandmother   . Hyperlipidemia Maternal Grandmother   . Heart disease Paternal Grandmother   . Heart disease Paternal Grandfather   . Hypertension Paternal Grandfather   . Cancer Sister   . Hyperlipidemia Brother     Past Surgical History:  Procedure Laterality Date  . ANTERIOR CRUCIATE LIGAMENT REPAIR    . CESAREAN SECTION    . TUBAL LIGATION     Social History   Occupational History  . Not on file  Tobacco Use  . Smoking status: Never Smoker  . Smokeless tobacco: Never Used  Substance and Sexual Activity  . Alcohol use: Yes    Alcohol/week: 1.0 standard drink    Types: 1 Glasses of wine per week  . Drug use: No  . Sexual activity: Not on file

## 2019-10-21 ENCOUNTER — Encounter: Payer: 59 | Admitting: Gastroenterology

## 2019-12-09 ENCOUNTER — Encounter: Payer: Self-pay | Admitting: *Deleted

## 2019-12-12 ENCOUNTER — Telehealth: Payer: Self-pay | Admitting: Registered Nurse

## 2019-12-12 NOTE — Telephone Encounter (Signed)
Referral followup 

## 2019-12-16 ENCOUNTER — Other Ambulatory Visit: Payer: Self-pay | Admitting: Registered Nurse

## 2019-12-16 DIAGNOSIS — F411 Generalized anxiety disorder: Secondary | ICD-10-CM

## 2019-12-20 ENCOUNTER — Other Ambulatory Visit: Payer: Self-pay | Admitting: Registered Nurse

## 2019-12-20 DIAGNOSIS — F411 Generalized anxiety disorder: Secondary | ICD-10-CM

## 2020-01-01 ENCOUNTER — Encounter: Payer: Self-pay | Admitting: Registered Nurse

## 2020-01-01 ENCOUNTER — Other Ambulatory Visit: Payer: Self-pay | Admitting: Registered Nurse

## 2020-01-01 DIAGNOSIS — F411 Generalized anxiety disorder: Secondary | ICD-10-CM

## 2020-01-02 ENCOUNTER — Other Ambulatory Visit: Payer: Self-pay | Admitting: Registered Nurse

## 2020-01-02 DIAGNOSIS — F411 Generalized anxiety disorder: Secondary | ICD-10-CM

## 2020-01-02 MED ORDER — ZOLPIDEM TARTRATE 5 MG PO TABS: 5.0000 mg | ORAL_TABLET | Freq: Every evening | ORAL | 0 refills | Status: DC | PRN

## 2020-01-02 MED ORDER — VALACYCLOVIR HCL 1 G PO TABS: ORAL_TABLET | ORAL | 3 refills | Status: DC

## 2020-01-02 MED ORDER — SUMATRIPTAN SUCCINATE 100 MG PO TABS: 100.0000 mg | ORAL_TABLET | ORAL | 5 refills | Status: DC | PRN

## 2020-01-02 NOTE — Telephone Encounter (Signed)
Pt Ambien didn't make it to pharmacy, pt also requesting Imitrex and Valtrex please advise or prescribe

## 2020-03-22 ENCOUNTER — Other Ambulatory Visit: Payer: Self-pay | Admitting: Registered Nurse

## 2020-03-22 DIAGNOSIS — F411 Generalized anxiety disorder: Secondary | ICD-10-CM

## 2020-05-20 ENCOUNTER — Other Ambulatory Visit: Payer: Self-pay | Admitting: Registered Nurse

## 2020-05-20 DIAGNOSIS — F411 Generalized anxiety disorder: Secondary | ICD-10-CM

## 2020-05-20 NOTE — Telephone Encounter (Signed)
Requested medications are due for refill today.  yes  Requested medications are on the active medications list.  yes  Last refill. 08/01/2019  Future visit scheduled.   no  Notes to clinic.  Pt is more than 3 months overdue for appointment.

## 2020-05-21 ENCOUNTER — Other Ambulatory Visit: Payer: Self-pay | Admitting: Registered Nurse

## 2020-05-21 DIAGNOSIS — F411 Generalized anxiety disorder: Secondary | ICD-10-CM

## 2020-05-21 NOTE — Telephone Encounter (Signed)
Requested medication (s) are due for refill today - yes  Requested medication (s) are on the active medication list -yes  Future visit scheduled -no  Last refill: 03/23/20  Notes to clinic: Request RF non delegated Rx  Requested Prescriptions  Pending Prescriptions Disp Refills   zolpidem (AMBIEN) 10 MG tablet [Pharmacy Med Name: ZOLPIDEM TARTRATE 10 MG TABLET] 30 tablet 0    Sig: TAKE 1 TABLET BY MOUTH AT BEDTIME AS NEEDED FOR SLEEP      Not Delegated - Psychiatry:  Anxiolytics/Hypnotics Failed - 05/21/2020  3:25 PM      Failed - This refill cannot be delegated      Failed - Urine Drug Screen completed in last 360 days      Failed - Valid encounter within last 6 months    Recent Outpatient Visits           9 months ago Left ankle pain, unspecified chronicity   [facility], NP   1 year ago Need for diphtheria-tetanus-pertussis (Tdap) vaccine   [facility], Delfino Lovett, NP   1 year ago Pleuritic pain   [facility], Delfino Lovett, NP   1 year ago Pleuritic pain   [facility], [place], NP   4 years ago Chest pain, unspecified type   [facility], Ines Bloomer, MD                    Requested Prescriptions  Pending Prescriptions Disp Refills   zolpidem (AMBIEN) 10 MG tablet [Pharmacy Med Name: ZOLPIDEM TARTRATE 10 MG TABLET] 30 tablet 0    Sig: TAKE 1 TABLET BY MOUTH AT BEDTIME AS NEEDED FOR SLEEP      Not Delegated - Psychiatry:  Anxiolytics/Hypnotics Failed - 05/21/2020  3:25 PM      Failed - This refill cannot be delegated      Failed - Urine Drug Screen completed in last 360 days      Failed - Valid encounter within last 6 months    Recent Outpatient Visits           9 months ago Left ankle pain, unspecified chronicity   [facility], NP   1 year ago Need for diphtheria-tetanus-pertussis (Tdap) vaccine   [facility],  NP   1 year ago Pleuritic pain   [facility], Delfino Lovett, NP   1 year ago Pleuritic pain   [facility], [place], NP   4 years ago Chest pain, unspecified type   [facility] at [facility], [place], MD

## 2020-05-22 NOTE — Telephone Encounter (Signed)
Patient is requesting a refill of the following medications: Requested Prescriptions   Pending Prescriptions Disp Refills   zolpidem (AMBIEN) 10 MG tablet [Pharmacy Med Name: ZOLPIDEM TARTRATE 10 MG TABLET] 30 tablet 0    Sig: TAKE 1 TABLET BY MOUTH AT BEDTIME AS NEEDED FOR SLEEP    Date of patient request: 08/12/19 Last office visit: 03/23/20 Date of last refill: 03/23/20 Last refill amount:30 Follow up time period per chart:

## 2020-05-22 NOTE — Telephone Encounter (Signed)
No further refills without office visit 

## 2020-05-22 NOTE — Telephone Encounter (Signed)
Please schedule appt for med review and refills 30 day supply has been sent

## 2020-05-23 ENCOUNTER — Telehealth: Payer: Self-pay | Admitting: Registered Nurse

## 2020-05-23 NOTE — Telephone Encounter (Signed)
05/23/2020 - PATIENT REQUESTING A REFILL ON HER BUSPIRONE 5 mg. I CALLED HER TO SCHEDULE AN APPOINTMENT WITH RICH MORROW. SHE WILL TRY TO ESTABLISH WITH [facility] SINCE THAT IS CLOSER TO HER HOME. SHE WILL CALL BACK IF SHE WANTS AN OFFICE VISIT WITH RICH BEFORE THE OFFICE CLOSES. FELICIA K. SENT IN A 30 DAY SUPPLY. [place]

## 2020-05-23 NOTE — Telephone Encounter (Signed)
05/23/2020 - PATIENT IS REQUESTING A REFILL ON HER BUSPIRONE 5 mg. I CALLED TO SCHEDULE HER A MEDICATION REVIEW OFFICE VISIT. SHE HAD RECEIVED THE MY-CHART MESSAGE REGARDING THE OFFICE CLOSING. SHE WILL CALL [facility] TO TRY AND ESTABLISH CARE. [place] IN [place] IS TOO FAR. SHE WILL CALL us BACK IF SHE WANTS TO MAKE AN APPOINTMENT WITH RICH MORROW BEFORE WE CLOSE. [place]

## 2020-05-29 ENCOUNTER — Other Ambulatory Visit: Payer: Self-pay | Admitting: Registered Nurse

## 2020-05-29 DIAGNOSIS — R0781 Pleurodynia: Secondary | ICD-10-CM

## 2020-05-29 DIAGNOSIS — R0602 Shortness of breath: Secondary | ICD-10-CM

## 2020-05-29 NOTE — Telephone Encounter (Signed)
Requested medication (s) are due for refill today:   Yes  Requested medication (s) are on the active medication list:   Yes  Future visit scheduled:   No   Has an appt with another provider at [facility]   Last ordered: 03/29/2019 18 g, 0 refills  Clinic note:  Returned for provider to review for refill,   Requested Prescriptions  Pending Prescriptions Disp Refills   VENTOLIN HFA 108 (90 Base) MCG/ACT inhaler [Pharmacy Med Name: VENTOLIN HFA 90 MCG INHALER] 18 each     Sig: TAKE 2 PUFFS BY MOUTH EVERY 6 HOURS AS NEEDED FOR WHEEZE OR SHORTNESS OF BREATH      Pulmonology:  Beta Agonists Failed - 05/29/2020  3:14 PM      Failed - One inhaler should last at least one month. If the patient is requesting refills earlier, contact the patient to check for uncontrolled symptoms.      Passed - Valid encounter within last 12 months    Recent Outpatient Visits           9 months ago Left ankle pain, unspecified chronicity   [facility], Delfino Lovett, NP   1 year ago Need for diphtheria-tetanus-pertussis (Tdap) vaccine   [facility] [place], NP   1 year ago Pleuritic pain   [facility], Delfino Lovett, NP   1 year ago Pleuritic pain   [facility], Delfino Lovett, NP   4 years ago Chest pain, unspecified type   [facility] at [facility], Ines Bloomer, MD       Future Appointments             In 2 months Valere Dross, Marvis Repress, [place] at [facility], [facility]

## 2020-05-30 NOTE — Telephone Encounter (Signed)
No further refills without office visit 

## 2020-06-22 ENCOUNTER — Other Ambulatory Visit: Payer: Self-pay | Admitting: Registered Nurse

## 2020-06-22 DIAGNOSIS — F411 Generalized anxiety disorder: Secondary | ICD-10-CM

## 2020-07-29 ENCOUNTER — Other Ambulatory Visit: Payer: Self-pay | Admitting: Registered Nurse

## 2020-07-30 NOTE — Telephone Encounter (Signed)
LFD 01/02/20 #10 with 5 refills LOV 08/12/19 NOV 08/03/20 New patient at [facility]

## 2020-08-03 ENCOUNTER — Encounter: Payer: Self-pay | Admitting: Family

## 2020-08-03 ENCOUNTER — Other Ambulatory Visit: Payer: Self-pay

## 2020-08-03 ENCOUNTER — Ambulatory Visit: Payer: 59 | Admitting: Family

## 2020-08-03 VITALS — BP 110/70 | HR 81 | Temp 98.1°F | Ht 66.0 in | Wt 255.4 lb

## 2020-08-03 DIAGNOSIS — Z1211 Encounter for screening for malignant neoplasm of colon: Secondary | ICD-10-CM

## 2020-08-03 DIAGNOSIS — M542 Cervicalgia: Secondary | ICD-10-CM

## 2020-08-03 DIAGNOSIS — Z13220 Encounter for screening for lipoid disorders: Secondary | ICD-10-CM

## 2020-08-03 DIAGNOSIS — G8929 Other chronic pain: Secondary | ICD-10-CM

## 2020-08-03 DIAGNOSIS — Z1231 Encounter for screening mammogram for malignant neoplasm of breast: Secondary | ICD-10-CM | POA: Diagnosis not present

## 2020-08-03 DIAGNOSIS — F411 Generalized anxiety disorder: Secondary | ICD-10-CM

## 2020-08-03 DIAGNOSIS — M545 Low back pain, unspecified: Secondary | ICD-10-CM

## 2020-08-03 DIAGNOSIS — B009 Herpesviral infection, unspecified: Secondary | ICD-10-CM

## 2020-08-03 DIAGNOSIS — G4700 Insomnia, unspecified: Secondary | ICD-10-CM

## 2020-08-03 DIAGNOSIS — G43809 Other migraine, not intractable, without status migrainosus: Secondary | ICD-10-CM

## 2020-08-03 DIAGNOSIS — R5383 Other fatigue: Secondary | ICD-10-CM

## 2020-08-03 MED ORDER — ZOLPIDEM TARTRATE 10 MG PO TABS: 10.0000 mg | ORAL_TABLET | Freq: Every evening | ORAL | 1 refills | Status: DC | PRN

## 2020-08-03 MED ORDER — BUSPIRONE HCL 5 MG PO TABS: 5.0000 mg | ORAL_TABLET | Freq: Three times a day (TID) | ORAL | 1 refills | Status: DC | PRN

## 2020-08-03 MED ORDER — VALACYCLOVIR HCL 1 G PO TABS: ORAL_TABLET | ORAL | 3 refills | Status: DC

## 2020-08-03 MED ORDER — SUMATRIPTAN SUCCINATE 100 MG PO TABS: 100.0000 mg | ORAL_TABLET | ORAL | 5 refills | Status: DC | PRN

## 2020-08-03 MED ORDER — MELOXICAM 15 MG PO TABS: 15.0000 mg | ORAL_TABLET | Freq: Every day | ORAL | 0 refills | Status: DC

## 2020-08-03 NOTE — Progress Notes (Signed)
[name] [name] is a 50 y.o. female with the following history as recorded in EpicCare:  Patient Active Problem List   Diagnosis Date Noted  . Left knee injury 01/10/2015    Current Outpatient Medications  Medication Sig Dispense Refill  . meloxicam (MOBIC) 15 MG tablet Take 1 tablet (15 mg total) by mouth daily. 30 tablet 0  . VENTOLIN HFA 108 (90 Base) MCG/ACT inhaler TAKE 2 PUFFS BY MOUTH EVERY 6 HOURS AS NEEDED FOR WHEEZE OR SHORTNESS OF BREATH 2 each 0  . busPIRone (BUSPAR) 5 MG tablet Take 1 tablet (5 mg total) by mouth 3 (three) times daily as needed. 270 tablet 1  . SUMAtriptan (IMITREX) 100 MG tablet Take 1 tablet (100 mg total) by mouth every 2 (two) hours as needed. 10 tablet 5  . valACYclovir (VALTREX) 1000 MG tablet TAKE 1 TABLET BY MOUTH EVERY DAY. 90 tablet 3  . zolpidem (AMBIEN) 10 MG tablet Take 1 tablet (10 mg total) by mouth at bedtime as needed. for sleep 30 tablet 1   No current facility-administered medications for this visit.    Allergies: Patient has no known allergies.  Past Medical History:  Diagnosis Date  . Anxiety   . Cancer ([place]) 1995  . Herpes genitalis in women   . Migraine     Past Surgical History:  Procedure Laterality Date  . ANTERIOR CRUCIATE LIGAMENT REPAIR    . CESAREAN SECTION    . TUBAL LIGATION      Family History  Problem Relation Age of Onset  . Healthy Mother   . Arthritis Mother   . Hearing loss Mother   . Hypertension Mother   . Heart disease Father   . Diabetes Father   . Arthritis Father   . Alcohol abuse Father   . COPD Father   . Hearing loss Father   . Hypertension Father   . Kidney disease Father   . Hyperlipidemia Father   . Diabetes Maternal Grandmother   . Arthritis Maternal Grandmother   . Asthma Maternal Grandmother   . Cancer Maternal Grandmother   . Heart disease Maternal Grandmother   . Hypertension Maternal Grandmother   . Arthritis Maternal Grandfather   . Alcohol abuse Maternal Grandfather   .  Diabetes Maternal Grandfather   . Hearing loss Maternal Grandfather   . Heart disease Maternal Grandfather   . Hyperlipidemia Maternal Grandfather   . Hypertension Maternal Grandfather   . Kidney disease Maternal Grandfather   . Heart disease Paternal Grandmother   . Arthritis Paternal Grandmother   . Cancer Paternal Grandmother   . Diabetes Paternal Grandmother   . Hyperlipidemia Paternal Grandmother   . Hypertension Paternal Grandmother   . Stroke Paternal Grandmother   . Heart disease Paternal Grandfather   . Arthritis Paternal Grandfather   . Cancer Paternal Grandfather   . COPD Paternal Grandfather   . Diabetes Paternal Grandfather   . Hearing loss Paternal Grandfather   . Hyperlipidemia Paternal Grandfather   . Hypertension Paternal Grandfather   . Kidney disease Paternal Grandfather   . Arthritis Sister   . Asthma Sister   . Miscarriages / Stillbirths Sister   . Arthritis Brother   . Asthma Son   . Arthritis Sister   . Depression Sister   . Hearing loss Sister   . Asthma Sister   . Cancer Sister   . Arthritis Brother   . Diabetes Brother   . Hyperlipidemia Brother   . Hypertension Brother  Social History   Tobacco Use  . Smoking status: Never Smoker  . Smokeless tobacco: Never Used  Substance Use Topics  . Alcohol use: Yes    Alcohol/week: 1.0 standard drink    Types: 1 Glasses of wine per week    Subjective:  Presents as new patient/ transfer from provider at [place]; Requesting refills on her medications; Overdue for mammogram/ colonoscopy; Chronic pain in right shoulder/ neck and tailbone- no specific injury;       Objective:  Vitals:   08/03/20 1415  BP: 110/70  Pulse: 81  Temp: 98.1 F (36.7 C)  TempSrc: Oral  SpO2: 99%  Weight: 255 lb 6.4 oz (115.8 kg)  Height: 5\' 6"  (1.676 m)    General: Well developed, well nourished, in no acute distress  Skin : Warm and dry.  Head: Normocephalic and atraumatic  Eyes: Sclera and conjunctiva  clear; pupils round and reactive to light; extraocular movements intact  Ears: External normal; canals clear; tympanic membranes normal  Oropharynx: Pink, supple. No suspicious lesions  Neck: Supple without thyromegaly, adenopathy  Lungs: Respirations unlabored; clear to auscultation bilaterally without wheeze, rales, rhonchi  CVS exam: normal rate and regular rhythm.  Musculoskeletal: No deformities; no active joint inflammation  Extremities: No edema, cyanosis, clubbing  Vessels: Symmetric bilaterally  Neurologic: Alert and oriented; speech intact; face symmetrical; moves all extremities well; CNII-XII intact without focal deficit   Assessment:  1. Anxiety state   2. Visit for screening mammogram   3. Encounter for screening colonoscopy   4. Neck pain   5. Chronic low back pain, unspecified back pain laterality, unspecified whether sciatica present   6. Other migraine without status migrainosus, not intractable   7. HSV infection   8. Insomnia, unspecified type     Plan:  1. Stable; refill updated;  2. Order updated; 3. Order updated; 4. Refer to ortho; 5. Refer to ortho; 6. Refill updated; 7. Refill updated; 8. Refill updated;  She will return for fasting labs at later date- orders in place;  This visit occurred during the SARS-CoV-2 public health emergency.  Safety protocols were in place, including screening questions prior to the visit, additional usage of staff PPE, and extensive cleaning of exam room while observing appropriate contact time as indicated for disinfecting solutions.     Return for fasting lab appointment.  Orders Placed This Encounter  Procedures  . MM Digital Screening    Standing Status:   Future    Standing Expiration Date:   08/03/2021    Order Specific Question:   Reason for Exam (SYMPTOM  OR DIAGNOSIS REQUIRED)    Answer:   screening mammogram    Order Specific Question:   Is the patient pregnant?    Answer:   No    Order Specific Question:    Preferred imaging location?    Answer:   [facility]  . Ambulatory referral to Gastroenterology    Referral Priority:   Routine    Referral Type:   Consultation    Referral Reason:   Specialty Services Required    Number of Visits Requested:   1  . Ambulatory referral to Orthopedic Surgery    Referral Priority:   Routine    Referral Type:   Surgical    Referral Reason:   Specialty Services Required    Requested Specialty:   Orthopedic Surgery    Number of Visits Requested:   1    Requested Prescriptions   Signed Prescriptions Disp Refills  .  busPIRone (BUSPAR) 5 MG tablet 270 tablet 1    Sig: Take 1 tablet (5 mg total) by mouth 3 (three) times daily as needed.  . meloxicam (MOBIC) 15 MG tablet 30 tablet 0    Sig: Take 1 tablet (15 mg total) by mouth daily.  . SUMAtriptan (IMITREX) 100 MG tablet 10 tablet 5    Sig: Take 1 tablet (100 mg total) by mouth every 2 (two) hours as needed.  . valACYclovir (VALTREX) 1000 MG tablet 90 tablet 3    Sig: TAKE 1 TABLET BY MOUTH EVERY DAY.  Marland Kitchen zolpidem (AMBIEN) 10 MG tablet 30 tablet 1    Sig: Take 1 tablet (10 mg total) by mouth at bedtime as needed. for sleep

## 2020-08-07 ENCOUNTER — Other Ambulatory Visit: Payer: Self-pay

## 2020-08-07 ENCOUNTER — Other Ambulatory Visit (INDEPENDENT_AMBULATORY_CARE_PROVIDER_SITE_OTHER): Payer: 59

## 2020-08-07 DIAGNOSIS — R5383 Other fatigue: Secondary | ICD-10-CM

## 2020-08-07 DIAGNOSIS — Z13220 Encounter for screening for lipoid disorders: Secondary | ICD-10-CM

## 2020-08-07 LAB — LIPID PANEL
Cholesterol: 173 mg/dL (ref 0–200)
HDL: 45.2 mg/dL (ref >39.00)
LDL Cholesterol: 112 mg/dL — ABNORMAL HIGH (ref 0–99)
NonHDL: 127.59
Total CHOL/HDL Ratio: 4
Triglycerides: 80 mg/dL (ref 0.0–149.0)
VLDL: 16 mg/dL (ref 0.0–40.0)

## 2020-08-07 LAB — CBC WITH DIFFERENTIAL/PLATELET
Basophils Absolute: 0 10*3/uL (ref 0.0–0.1)
Basophils Relative: 0.5 % (ref 0.0–3.0)
Eosinophils Absolute: 0.1 10*3/uL (ref 0.0–0.7)
Eosinophils Relative: 1.2 % (ref 0.0–5.0)
HCT: 38.2 % (ref 36.0–46.0)
Hemoglobin: 12.9 g/dL (ref 12.0–15.0)
Lymphocytes Relative: 36.4 % (ref 12.0–46.0)
Lymphs Abs: 2 10*3/uL (ref 0.7–4.0)
MCHC: 33.8 g/dL (ref 30.0–36.0)
MCV: 93.6 fl (ref 78.0–100.0)
Monocytes Absolute: 0.5 10*3/uL (ref 0.1–1.0)
Monocytes Relative: 8.3 % (ref 3.0–12.0)
Neutro Abs: 3 10*3/uL (ref 1.4–7.7)
Neutrophils Relative %: 53.6 % (ref 43.0–77.0)
Platelets: 258 10*3/uL (ref 150.0–400.0)
RBC: 4.08 Mil/uL (ref 3.87–5.11)
RDW: 12.8 % (ref 11.5–15.5)
WBC: 5.6 10*3/uL (ref 4.0–10.5)

## 2020-08-07 LAB — COMPREHENSIVE METABOLIC PANEL
ALT: 16 U/L (ref 0–35)
AST: 17 U/L (ref 0–37)
Albumin: 4.2 g/dL (ref 3.5–5.2)
Alkaline Phosphatase: 85 U/L (ref 39–117)
BUN: 20 mg/dL (ref 6–23)
CO2: 25 mEq/L (ref 19–32)
Calcium: 9.4 mg/dL (ref 8.4–10.5)
Chloride: 105 mEq/L (ref 96–112)
Creatinine, Ser: 1.05 mg/dL (ref 0.40–1.20)
GFR: 61.65 mL/min (ref >60.00)
Glucose, Bld: 100 mg/dL — ABNORMAL HIGH (ref 70–99)
Potassium: 4.6 mEq/L (ref 3.5–5.1)
Sodium: 139 mEq/L (ref 135–145)
Total Bilirubin: 0.5 mg/dL (ref 0.2–1.2)
Total Protein: 7 g/dL (ref 6.0–8.3)

## 2020-08-07 LAB — TSH: TSH: 2.41 u[IU]/mL (ref 0.35–4.50)

## 2020-08-16 ENCOUNTER — Ambulatory Visit: Payer: BLUE CROSS/BLUE SHIELD | Admitting: Podiatry

## 2020-09-03 ENCOUNTER — Encounter (HOSPITAL_BASED_OUTPATIENT_CLINIC_OR_DEPARTMENT_OTHER): Payer: Self-pay

## 2020-09-03 ENCOUNTER — Ambulatory Visit (HOSPITAL_BASED_OUTPATIENT_CLINIC_OR_DEPARTMENT_OTHER)
Admission: RE | Admit: 2020-09-03 | Discharge: 2020-09-03 | Disposition: A | Discharge status: Home / Self Care | Payer: 59 | Source: Ambulatory Visit | Attending: Family | Admitting: Family

## 2020-09-03 ENCOUNTER — Other Ambulatory Visit: Payer: Self-pay

## 2020-09-03 DIAGNOSIS — Z1231 Encounter for screening mammogram for malignant neoplasm of breast: Secondary | ICD-10-CM | POA: Insufficient documentation

## 2020-11-29 ENCOUNTER — Other Ambulatory Visit: Payer: Self-pay | Admitting: Family

## 2020-11-29 DIAGNOSIS — F411 Generalized anxiety disorder: Secondary | ICD-10-CM

## 2020-11-30 ENCOUNTER — Other Ambulatory Visit: Payer: Self-pay | Admitting: Family

## 2020-11-30 NOTE — Telephone Encounter (Signed)
Requesting: zolpidem Last Visit: 08/03/20 Next Visit: none Last Refill: 08/03/20  Please Advise

## 2021-06-04 ENCOUNTER — Telehealth (INDEPENDENT_AMBULATORY_CARE_PROVIDER_SITE_OTHER): Payer: BLUE CROSS/BLUE SHIELD | Admitting: Family

## 2021-06-04 ENCOUNTER — Encounter: Payer: Self-pay | Admitting: Family

## 2021-06-04 VITALS — HR 62 | Ht 66.0 in | Wt 250.0 lb

## 2021-06-04 DIAGNOSIS — R0781 Pleurodynia: Secondary | ICD-10-CM

## 2021-06-04 DIAGNOSIS — R0602 Shortness of breath: Secondary | ICD-10-CM | POA: Diagnosis not present

## 2021-06-04 DIAGNOSIS — F411 Generalized anxiety disorder: Secondary | ICD-10-CM | POA: Diagnosis not present

## 2021-06-04 DIAGNOSIS — M25572 Pain in left ankle and joints of left foot: Secondary | ICD-10-CM

## 2021-06-04 DIAGNOSIS — G43809 Other migraine, not intractable, without status migrainosus: Secondary | ICD-10-CM

## 2021-06-04 DIAGNOSIS — Z1211 Encounter for screening for malignant neoplasm of colon: Secondary | ICD-10-CM

## 2021-06-04 DIAGNOSIS — G8929 Other chronic pain: Secondary | ICD-10-CM

## 2021-06-04 DIAGNOSIS — B009 Herpesviral infection, unspecified: Secondary | ICD-10-CM

## 2021-06-04 MED ORDER — VALACYCLOVIR HCL 1 G PO TABS: ORAL_TABLET | ORAL | 3 refills | Status: DC

## 2021-06-04 MED ORDER — SUMATRIPTAN SUCCINATE 100 MG PO TABS: 100.0000 mg | ORAL_TABLET | ORAL | 1 refills | Status: DC | PRN

## 2021-06-04 MED ORDER — ALBUTEROL SULFATE HFA 108 (90 BASE) MCG/ACT IN AERS: INHALATION_SPRAY | RESPIRATORY_TRACT | 1 refills | Status: DC

## 2021-06-04 MED ORDER — MELOXICAM 15 MG PO TABS: 15.0000 mg | ORAL_TABLET | Freq: Every day | ORAL | 0 refills | Status: DC

## 2021-06-04 MED ORDER — ZOLPIDEM TARTRATE 10 MG PO TABS: ORAL_TABLET | ORAL | 1 refills | Status: DC

## 2021-06-04 MED ORDER — BUSPIRONE HCL 5 MG PO TABS: 5.0000 mg | ORAL_TABLET | Freq: Three times a day (TID) | ORAL | 1 refills | Status: DC | PRN

## 2021-06-04 NOTE — Progress Notes (Signed)
?[name] [name] is a 51 y.o. female with the following history as recorded in EpicCare:  ?Patient Active Problem List  ? Diagnosis Date Noted  ? Left knee injury 01/10/2015  ?  ?Current Outpatient Medications  ?Medication Sig Dispense Refill  ? albuterol (VENTOLIN HFA) 108 (90 Base) MCG/ACT inhaler TAKE 2 PUFFS BY MOUTH EVERY 6 HOURS AS NEEDED FOR WHEEZE OR SHORTNESS OF BREATH 2 each 1  ? busPIRone (BUSPAR) 5 MG tablet Take 1 tablet (5 mg total) by mouth 3 (three) times daily as needed. 270 tablet 1  ? meloxicam (MOBIC) 15 MG tablet Take 1 tablet (15 mg total) by mouth daily. 30 tablet 0  ? SUMAtriptan (IMITREX) 100 MG tablet Take 1 tablet (100 mg total) by mouth every 2 (two) hours as needed. 30 tablet 1  ? valACYclovir (VALTREX) 1000 MG tablet TAKE 1 TABLET BY MOUTH EVERY DAY. 90 tablet 3  ? zolpidem (AMBIEN) 10 MG tablet TAKE 1 TABLET(10 MG) BY MOUTH AT BEDTIME AS NEEDED FOR SLEEP 30 tablet 1  ? ?No current facility-administered medications for this visit.  ?  ?Allergies: Patient has no known allergies.  ?Past Medical History:  ?Diagnosis Date  ? Anxiety   ? Cancer [name]) 1995  ? Herpes genitalis in women   ? Migraine   ?  ?Past Surgical History:  ?Procedure Laterality Date  ? ANTERIOR CRUCIATE LIGAMENT REPAIR    ? CESAREAN SECTION    ? TUBAL LIGATION    ?  ?Family History  ?Problem Relation Age of Onset  ? Healthy Mother   ? Arthritis Mother   ? Hearing loss Mother   ? Hypertension Mother   ? Heart disease Father   ? Diabetes Father   ? Arthritis Father   ? Alcohol abuse Father   ? COPD Father   ? Hearing loss Father   ? Hypertension Father   ? Kidney disease Father   ? Hyperlipidemia Father   ? Diabetes Maternal Grandmother   ? Arthritis Maternal Grandmother   ? Asthma Maternal Grandmother   ? Cancer Maternal Grandmother   ? Heart disease Maternal Grandmother   ? Hypertension Maternal Grandmother   ? Arthritis Maternal Grandfather   ? Alcohol abuse Maternal Grandfather   ? Diabetes Maternal Grandfather   ?  Hearing loss Maternal Grandfather   ? Heart disease Maternal Grandfather   ? Hyperlipidemia Maternal Grandfather   ? Hypertension Maternal Grandfather   ? Kidney disease Maternal Grandfather   ? Heart disease Paternal Grandmother   ? Arthritis Paternal Grandmother   ? Cancer Paternal Grandmother   ? Diabetes Paternal Grandmother   ? Hyperlipidemia Paternal Grandmother   ? Hypertension Paternal Grandmother   ? Stroke Paternal Grandmother   ? Heart disease Paternal Grandfather   ? Arthritis Paternal Grandfather   ? Cancer Paternal Grandfather   ? COPD Paternal Grandfather   ? Diabetes Paternal Grandfather   ? Hearing loss Paternal Grandfather   ? Hyperlipidemia Paternal Grandfather   ? Hypertension Paternal Grandfather   ? Kidney disease Paternal Grandfather   ? Arthritis Sister   ? Asthma Sister   ? Miscarriages / Stillbirths Sister   ? Arthritis Brother   ? Asthma Son   ? Arthritis Sister   ? Depression Sister   ? Hearing loss Sister   ? Asthma Sister   ? Cancer Sister   ? Arthritis Brother   ? Diabetes Brother   ? Hyperlipidemia Brother   ? Hypertension Brother   ?  ?  Social History  ? ?Tobacco Use  ? Smoking status: Never  ? Smokeless tobacco: Never  ?Substance Use Topics  ? Alcohol use: Yes  ?  Alcohol/week: 1.0 standard drink  ?  Types: 1 Glasses of wine per week  ?  ?Subjective:  ? ? ? ?I connected with [name] [name] on 06/04/21 at  1:20 PM EDT by a video enabled telemedicine application and verified that I am speaking with the correct person using two identifiers. ?  ?I discussed the limitations of evaluation and management by telemedicine and the availability of in person appointments. The patient expressed understanding and agreed to proceed. ?Provider in office/ patient is at home; provider and patient are only 2 people on video call.  ? ?Requesting refills on medications- has gotten new insurance and needs to change pharmacy; due for in person yearly OV in 07/2021;  ? ?Did not get scheduled for  colonoscopy last year as discussed- needs updated referral; ?Continuing to struggle with chronic ankle pain- asking for referral to podiatrist;  ? ? ? ? ?Objective:  ?Vitals:  ? 06/04/21 1326  ?Pulse: 62  ?SpO2: 98%  ?Weight: 250 lb (113.4 kg)  ?Height: '5\' 6"'$  (1.676 m)  ?  ?General: Well developed, well nourished, in no acute distress  ?Lungs: Respirations unlabored;  ?Neurologic: Alert and oriented; speech intact; face symmetrical;  ? ?Assessment:  ?1. Chronic pain of left ankle   ?2. Anxiety state   ?3. Pleuritic pain   ?4. Shortness of breath   ?5. Encounter for screening colonoscopy   ?6. HSV infection   ?7. Other migraine without status migrainosus, not intractable   ?  ?Plan:  ?Refer to podiatrist;  ?Stable; refill updated;  ?Refill updated on albuterol; ?5.   Referral updated; ?6.   Refill updated; ?7.   Refill updated;  ? ?This visit occurred during the SARS-CoV-2 public health emergency.  Safety protocols were in place, including screening questions prior to the visit, additional usage of staff PPE, and extensive cleaning of exam room while observing appropriate contact time as indicated for disinfecting solutions.  ? ? ?No follow-ups on file.  ?Orders Placed This Encounter  ?Procedures  ? Ambulatory referral to Podiatry  ?  Referral Priority:   Routine  ?  Referral Type:   Consultation  ?  Referral Reason:   Specialty Services Required  ?  Requested Specialty:   Podiatry  ?  Number of Visits Requested:   1  ? Ambulatory referral to Gastroenterology  ?  Referral Priority:   Routine  ?  Referral Type:   Consultation  ?  Referral Reason:   Specialty Services Required  ?  Number of Visits Requested:   1  ?  ?Requested Prescriptions  ? ?Signed Prescriptions Disp Refills  ? busPIRone (BUSPAR) 5 MG tablet 270 tablet 1  ?  Sig: Take 1 tablet (5 mg total) by mouth 3 (three) times daily as needed.  ? SUMAtriptan (IMITREX) 100 MG tablet 30 tablet 1  ?  Sig: Take 1 tablet (100 mg total) by mouth every 2 (two) hours as  needed.  ? albuterol (VENTOLIN HFA) 108 (90 Base) MCG/ACT inhaler 2 each 1  ?  Sig: TAKE 2 PUFFS BY MOUTH EVERY 6 HOURS AS NEEDED FOR WHEEZE OR SHORTNESS OF BREATH  ? zolpidem (AMBIEN) 10 MG tablet 30 tablet 1  ?  Sig: TAKE 1 TABLET(10 MG) BY MOUTH AT BEDTIME AS NEEDED FOR SLEEP  ? valACYclovir (VALTREX) 1000 MG tablet 90 tablet  3  ?  Sig: TAKE 1 TABLET BY MOUTH EVERY DAY.  ? meloxicam (MOBIC) 15 MG tablet 30 tablet 0  ?  Sig: Take 1 tablet (15 mg total) by mouth daily.  ?  ? ?

## 2021-06-24 ENCOUNTER — Ambulatory Visit (INDEPENDENT_AMBULATORY_CARE_PROVIDER_SITE_OTHER): Payer: BLUE CROSS/BLUE SHIELD | Admitting: Podiatry

## 2021-06-24 ENCOUNTER — Ambulatory Visit (INDEPENDENT_AMBULATORY_CARE_PROVIDER_SITE_OTHER): Payer: BLUE CROSS/BLUE SHIELD

## 2021-06-24 DIAGNOSIS — M7752 Other enthesopathy of left foot: Secondary | ICD-10-CM

## 2021-06-24 DIAGNOSIS — M76822 Posterior tibial tendinitis, left leg: Secondary | ICD-10-CM

## 2021-06-24 DIAGNOSIS — G8929 Other chronic pain: Secondary | ICD-10-CM

## 2021-06-24 MED ORDER — MELOXICAM 15 MG PO TABS: 15.0000 mg | ORAL_TABLET | Freq: Every day | ORAL | 0 refills | Status: DC | PRN

## 2021-06-24 NOTE — Patient Instructions (Addendum)
I have ordered a MRI for [facility] of the left ankle. If you do not hear for them about scheduling within the next 1 week, or you have any questions please give [place] a call at [phone].   ? ?For instructions on how to put on your Tri-Lock Ankle Brace, please visit [email or web address] ? ?

## 2021-06-26 NOTE — Progress Notes (Signed)
Subjective:  ? ?Patient ID: [name] [name], female   DOB: 50 y.o.   MRN: [id number]  ? ?HPI ?51 year old female presents the office today for concerns of left ankle pain which has been ongoing for 4 years.  She does get swelling to the ankle.  She states that she is previously seen 2 other doctors for this.  The first provider said that she did have an ankle fusion.  She did follow-up with another orthopedic doctor and she went to a boot for some time as well as wearing orthotics and did some exercises which helped some.  She is continue to get discomfort she points on the medial aspect of the ankle.  No recent injuries that she reports.  She has no other concerns today. ? ? ?Review of Systems  ?All other systems reviewed and are negative. ? ?Past Medical History:  ?Diagnosis Date  ? Anxiety   ? Cancer [facility]) 1995  ? Herpes genitalis in women   ? Migraine   ? ? ?Past Surgical History:  ?Procedure Laterality Date  ? ANTERIOR CRUCIATE LIGAMENT REPAIR    ? CESAREAN SECTION    ? TUBAL LIGATION    ? ? ? ?Current Outpatient Medications:  ?  medroxyPROGESTERone (PROVERA) 10 MG tablet, TAKE 1 TAB BY MOUTH X 7 DAYS NOW, THEN REPEAT JANUARY 10-17. AFTER THAT 1 TAB DAILY X 7 DAYS AS NEEDED IF NO CYCLE, Disp: , Rfl:  ?  meloxicam (MOBIC) 15 MG tablet, Take 1 tablet (15 mg total) by mouth daily as needed for pain., Disp: 30 tablet, Rfl: 0 ?  albuterol (VENTOLIN HFA) 108 (90 Base) MCG/ACT inhaler, TAKE 2 PUFFS BY MOUTH EVERY 6 HOURS AS NEEDED FOR WHEEZE OR SHORTNESS OF BREATH, Disp: 2 each, Rfl: 1 ?  busPIRone (BUSPAR) 5 MG tablet, Take 1 tablet (5 mg total) by mouth 3 (three) times daily as needed., Disp: 270 tablet, Rfl: 1 ?  meloxicam (MOBIC) 15 MG tablet, Take 1 tablet (15 mg total) by mouth daily., Disp: 30 tablet, Rfl: 0 ?  SUMAtriptan (IMITREX) 100 MG tablet, Take 1 tablet (100 mg total) by mouth every 2 (two) hours as needed., Disp: 30 tablet, Rfl: 1 ?  valACYclovir (VALTREX) 1000 MG tablet, TAKE 1 TABLET BY MOUTH EVERY  DAY., Disp: 90 tablet, Rfl: 3 ?  zolpidem (AMBIEN) 10 MG tablet, TAKE 1 TABLET(10 MG) BY MOUTH AT BEDTIME AS NEEDED FOR SLEEP, Disp: 30 tablet, Rfl: 1 ? ?No Known Allergies ? ? ? ? ?   ?Objective:  ?Physical Exam  ?General: AAO x3, NAD ? ?Dermatological: Skin is warm, dry and supple bilateral.  There are no open sores, no preulcerative lesions, no rash or signs of infection present. ? ?Vascular: Dorsalis Pedis artery and Posterior Tibial artery pedal pulses are 2/4 bilateral with immedate capillary fill time.  There is no pain with calf compression, swelling, warmth, erythema.  ? ?Neruologic: Grossly intact via light touch bilateral.  ? ?Musculoskeletal: Decreased medial arch upon weightbearing.  She is unable to do a single heel rise on the left side but she is able to do a double heel rise.  Majority tenderness is localized on the medial aspect along the course of the posterior tibial complex with tenderness.  There is localized edema to the area but there is no erythema or warmth.  There is no area of pinpoint tenderness.  No pain or crepitation with ankle joint range of motion.  Muscular strength 5/5 in all groups tested bilateral. ? ?Gait:  Unassisted, Nonantalgic.  ? ? ?   ?Assessment:  ? ?Posterior tibial tendon dysfunction left side ? ?   ?Plan:  ?-Treatment options discussed including all alternatives, risks, and complications ?-Etiology of symptoms were discussed ?-X-rays were obtained and reviewed with the patient.  3 views of the left ankle were obtained.  No evidence of acute fracture.  Ankle joint space maintained. ?-Tri-Lock ankle brace dispensed to help aid in support and stability to take pressure off the posterior tibial tendon, ankle.  Prescribed meloxicam to use as needed.  Icing daily.  Given her longstanding nature of symptoms I recommended an MRI to further evaluate the integrity of the posterior tibial tendon as well as evaluate the ankle joint itself.  This was ordered today.  This is for  potential surgical planning as well ? ?Trula Slade DPM ? ?   ? ?

## 2021-07-06 ENCOUNTER — Ambulatory Visit
Admission: RE | Admit: 2021-07-06 | Discharge: 2021-07-06 | Disposition: A | Discharge status: Home / Self Care | Payer: BLUE CROSS/BLUE SHIELD | Source: Ambulatory Visit | Attending: Podiatry | Admitting: Podiatry

## 2021-07-06 DIAGNOSIS — M76822 Posterior tibial tendinitis, left leg: Secondary | ICD-10-CM

## 2021-07-08 ENCOUNTER — Telehealth: Payer: Self-pay | Admitting: Podiatry

## 2021-07-08 NOTE — Telephone Encounter (Signed)
PATIENT GOT A MRI ON SAT, 4/22. SHE WOULD LIKE A VIRTUAL FU TO GO OVER RESULTS THIS WK, IS THAT POSSIBLE? PLEASE ADVISE ?

## 2021-07-09 ENCOUNTER — Ambulatory Visit (INDEPENDENT_AMBULATORY_CARE_PROVIDER_SITE_OTHER): Payer: BLUE CROSS/BLUE SHIELD | Admitting: Podiatry

## 2021-07-09 DIAGNOSIS — M76829 Posterior tibial tendinitis, unspecified leg: Secondary | ICD-10-CM

## 2021-07-09 NOTE — Progress Notes (Signed)
I connected with  [name] [name] on 07/09/21 by a video enabled telemedicine application and verified that I am speaking with the correct person using two identifiers. ?  ?I discussed the limitations of evaluation and management by telemedicine. The patient expressed understanding and agreed to proceed. ? ?Patient: Home ?Provider: Office ? ?Subjective: ?Patient wishes to proceed with a telephone visit in order to discuss the MRI results.  She states that overall she is still having discomfort without significant changes.  She has no new concerns today. ? ?Objective: ?Tenderness still present along the left ankle on the course the posterior tibial, flexor tendon.  No new areas of concern. ? ?Assessment: ?Posterior tibial tendon dysfunction, tear ? ?Plan: ?I reviewed the MRI with the patient which did reveal severe tendinosis of the posterior tibial tendon with longitudinal split tear with mild tenosynovitis.  I discussed the options including surgery as well as conservative care.  She would like to proceed with surgery as she is been doing this for quite some time. Plan on doing the surgery in June in the meantime I will work on insurance coverage for her.  In the meantime she is also to the bracing, meloxicam.  She will follow-up for consent.  She had no further questions. ? ?We discussed repair of the posterior tibial tendon.  During next appointment get calcaneal axial views and discussed possible bone work as well. ? ?42mn was spend with the patient ? ?MTrula SladeDPM ? ?

## 2021-08-02 ENCOUNTER — Ambulatory Visit: Payer: BLUE CROSS/BLUE SHIELD | Admitting: Podiatry

## 2021-08-08 ENCOUNTER — Other Ambulatory Visit: Payer: Self-pay | Admitting: Family

## 2021-08-08 DIAGNOSIS — R0602 Shortness of breath: Secondary | ICD-10-CM

## 2021-08-08 DIAGNOSIS — R0781 Pleurodynia: Secondary | ICD-10-CM

## 2021-09-03 ENCOUNTER — Other Ambulatory Visit: Payer: Self-pay | Admitting: Podiatry

## 2021-09-03 MED ORDER — MELOXICAM 15 MG PO TABS
15.0000 mg | ORAL_TABLET | Freq: Every day | ORAL | 0 refills | Status: DC | PRN
Start: 2021-09-03 — End: 2021-12-12

## 2021-09-06 ENCOUNTER — Telehealth: Payer: Self-pay | Admitting: Urology

## 2021-09-13 ENCOUNTER — Ambulatory Visit (INDEPENDENT_AMBULATORY_CARE_PROVIDER_SITE_OTHER): Payer: BLUE CROSS/BLUE SHIELD | Admitting: Podiatry

## 2021-09-13 ENCOUNTER — Encounter: Payer: Self-pay | Admitting: Podiatry

## 2021-09-13 ENCOUNTER — Ambulatory Visit (INDEPENDENT_AMBULATORY_CARE_PROVIDER_SITE_OTHER): Payer: BLUE CROSS/BLUE SHIELD

## 2021-09-13 DIAGNOSIS — M2141 Flat foot [pes planus] (acquired), right foot: Secondary | ICD-10-CM | POA: Diagnosis not present

## 2021-09-13 DIAGNOSIS — M2142 Flat foot [pes planus] (acquired), left foot: Secondary | ICD-10-CM | POA: Diagnosis not present

## 2021-09-13 DIAGNOSIS — M76822 Posterior tibial tendinitis, left leg: Secondary | ICD-10-CM

## 2021-09-13 DIAGNOSIS — M7752 Other enthesopathy of left foot: Secondary | ICD-10-CM | POA: Diagnosis not present

## 2021-09-13 NOTE — Patient Instructions (Signed)

## 2021-09-14 NOTE — Progress Notes (Signed)
Subjective: 51 year old female presents the office today for follow-up evaluation of chronic left ankle pain.  She has had ongoing discomfort for several years.  She had other opinions in regards to this.  She presents today for surgical consultation.  I previously contacted her and discussed this over the phone after the MRI was back.  Objective: AAO x3, NAD DP/PT pulses palpable bilaterally, CRT less than 3 seconds There is still tenderness palpation along the medial aspect ankle and the course of the posterior tibial tendon.  There is decreased medial arch upon weightbearing calcaneal valgus is noted.  There is no other areas of tenderness and no pain with ankle or subtalar joint range of motion. No pain with calf compression, swelling, warmth, erythema  Assessment: Posterior tibial tendon dysfunction, flatfoot  Plan: -All treatment options discussed with the patient including all alternatives, risks, complications.  -X-rays obtained reviewed.  2 views were obtained including lateral as well as calcaneal axial.  No evidence of acute fracture.  Clinically she has more valgus than the x-ray shows. -Again reviewed the MRI with her.  We discussed with conservative as well as surgical treatment options.  At this time she was proceed with surgical intervention.  Discussed with posterior tibial tendon repair as well as medial calcaneal slide osteotomy.  After discussion she wants to proceed with this.  We discussed the surgery as well as after surgery still needed shoes and inserts. -The incision placement as well as the postoperative course was discussed with the patient. I discussed risks of the surgery which include, but not limited to, infection, bleeding, pain, swelling, need for further surgery, delayed or nonhealing, painful or ugly scar, numbness or sensation changes, over/under correction, recurrence, transfer lesions, further deformity, hardware failure, DVT/PE, loss of toe/foot. Patient  understands these risks and wishes to proceed with surgery. The surgical consent was reviewed with the patient all 3 pages were signed. No promises or guarantees were given to the outcome of the procedure. All questions were answered to the best of my ability. Before the surgery the patient was encouraged to call the office if there is any further questions. The surgery will be performed at the [facility] on an outpatient basis. -Knee scooter ordered -Patient encouraged to call the office with any questions, concerns, change in symptoms.   Trula Slade DPM

## 2021-09-26 ENCOUNTER — Encounter: Payer: Self-pay | Admitting: Podiatry

## 2021-10-02 ENCOUNTER — Other Ambulatory Visit: Payer: Self-pay | Admitting: Podiatry

## 2021-10-02 DIAGNOSIS — M76822 Posterior tibial tendinitis, left leg: Secondary | ICD-10-CM

## 2021-10-02 MED ORDER — OXYCODONE-ACETAMINOPHEN 10-325 MG PO TABS: 1.0000 | ORAL_TABLET | ORAL | 0 refills | Status: DC | PRN

## 2021-10-02 MED ORDER — PROMETHAZINE HCL 25 MG PO TABS: 25.0000 mg | ORAL_TABLET | Freq: Three times a day (TID) | ORAL | 0 refills | Status: DC | PRN

## 2021-10-02 MED ORDER — IBUPROFEN 800 MG PO TABS
800.0000 mg | ORAL_TABLET | Freq: Three times a day (TID) | ORAL | 0 refills | Status: DC | PRN
Start: 2021-10-02 — End: 2021-10-18

## 2021-10-02 MED ORDER — CEPHALEXIN 500 MG PO CAPS: 500.0000 mg | ORAL_CAPSULE | Freq: Three times a day (TID) | ORAL | 0 refills | Status: DC

## 2021-10-02 NOTE — Progress Notes (Signed)
Postop medications sent 

## 2021-10-03 ENCOUNTER — Other Ambulatory Visit: Payer: Self-pay | Admitting: Podiatry

## 2021-10-03 NOTE — Telephone Encounter (Signed)
Please advise, currently taking Ibuprofen,duplicate medications

## 2021-10-07 ENCOUNTER — Ambulatory Visit (INDEPENDENT_AMBULATORY_CARE_PROVIDER_SITE_OTHER): Payer: BLUE CROSS/BLUE SHIELD | Admitting: Podiatry

## 2021-10-07 ENCOUNTER — Ambulatory Visit: Payer: BLUE CROSS/BLUE SHIELD

## 2021-10-07 ENCOUNTER — Ambulatory Visit (INDEPENDENT_AMBULATORY_CARE_PROVIDER_SITE_OTHER): Payer: BLUE CROSS/BLUE SHIELD

## 2021-10-07 DIAGNOSIS — Z9889 Other specified postprocedural states: Secondary | ICD-10-CM

## 2021-10-07 DIAGNOSIS — M76822 Posterior tibial tendinitis, left leg: Secondary | ICD-10-CM

## 2021-10-07 DIAGNOSIS — M2142 Flat foot [pes planus] (acquired), left foot: Secondary | ICD-10-CM

## 2021-10-07 DIAGNOSIS — M2141 Flat foot [pes planus] (acquired), right foot: Secondary | ICD-10-CM

## 2021-10-07 NOTE — Progress Notes (Signed)
Subjective: Chief Complaint  Patient presents with   Routine Post Op    POV #1 DOS 10/02/2021 REPAIR TENDON LT, EVANS CALCANEAL OSTEOTOMY LT, pts has some swelling , pain is 4 out 10, pt has had nausea and vomiting but no fevers or chills or SOB. Xrays taken toady      [name] [name] is a 51 y.o. is seen today in office s/p left foot calcaneal osteotomy, posterior tibial tendon repair preformed on 10/02/2021.  She states that she is doing okay.  Having some nausea but no chest pain or shortness of breath.  Pain is currently controlled.  No fevers or chills.  Objective: General: No acute distress, AAOx3  DP/PT pulses palpable 2/4, CRT < 3 sec to all digits.  Protective sensation intact. Motor function intact.  Left foot: Incision is well coapted without any evidence of dehiscence with sutures intact. There is no surrounding erythema, ascending cellulitis, fluctuance, crepitus, malodor, drainage/purulence. There is mild edema around the surgical site.  Mild ecchymosis present.  There is mild pain along the surgical site.  No other areas of tenderness to bilateral lower extremities.  No other open lesions or pre-ulcerative lesions.  No pain with calf compression, swelling, warmth, erythema.   Assessment and Plan:  Status post left foot surgery, doing well with no complications   -Treatment options discussed including all alternatives, risks, and complications -X-rays obtained reviewed.  Foot, ankle as well as calcaneal axial x-rays obtained.  Status post calcaneal ostomy with hardware intact any complicating factors. -Antibiotic ointment is applied to the incisions followed by dry sterile dressing.  She can keep dressing clean, dry, intact. -Working but at all times.  Nonweightbearing. -Ice/elevation -Pain medication as needed. -Monitor for any clinical signs or symptoms of infection and DVT/PE and directed to call the office immediately should any occur or go to the ER. -Follow-up as  scheduled for possible suture removal or sooner if any problems arise. In the meantime, encouraged to call the office with any questions, concerns, change in symptoms.   [name], DPM

## 2021-10-17 ENCOUNTER — Ambulatory Visit (INDEPENDENT_AMBULATORY_CARE_PROVIDER_SITE_OTHER): Payer: BLUE CROSS/BLUE SHIELD | Admitting: Podiatry

## 2021-10-17 ENCOUNTER — Encounter: Payer: Self-pay | Admitting: Podiatry

## 2021-10-17 DIAGNOSIS — M2142 Flat foot [pes planus] (acquired), left foot: Secondary | ICD-10-CM

## 2021-10-17 DIAGNOSIS — M2141 Flat foot [pes planus] (acquired), right foot: Secondary | ICD-10-CM

## 2021-10-17 DIAGNOSIS — M76822 Posterior tibial tendinitis, left leg: Secondary | ICD-10-CM

## 2021-10-17 DIAGNOSIS — Z9889 Other specified postprocedural states: Secondary | ICD-10-CM

## 2021-10-17 NOTE — Progress Notes (Signed)
Subjective: Chief Complaint  Patient presents with   Routine Post Op    POV #2 DOS 10/02/2021 REPAIR TENDON LT, EVANS CALCANEAL OSTEOTOMY LT    PARADISE VENSEL is a 51 y.o. is seen today in office s/p left foot calcaneal osteotomy, posterior tibial tendon repair preformed on 10/02/2021.  States that she is doing better.  Pain is better controlled.  Denies any fevers or chills.  No other concerns.  Objective: General: No acute distress, AAOx3  DP/PT pulses palpable 2/4, CRT < 3 sec to all digits.  Protective sensation intact. Motor function intact.  Left foot: Incision is well coapted without any evidence of dehiscence with sutures and staples intact. There is no surrounding erythema, ascending cellulitis, fluctuance, crepitus, malodor, drainage/purulence. There is mild and improved edema around the surgical site.  Resolving ecchymosis present.  There is mild pain along the surgical site.  No other areas of tenderness to bilateral lower extremities.  No other open lesions or pre-ulcerative lesions.  No pain with calf compression, swelling, warmth, erythema.   Assessment and Plan:  Status post left foot surgery, doing well with no complications   -Treatment options discussed including all alternatives, risks, and complications -To remove the sutures, staples intact.  Antibiotic ointment was applied followed by dressing.  She will keep the dressing clean, dry, intact. -Continue probing the boot I did pat this area.  Continue cam boot, nonweightbearing at all times.  Discussed casting also decided to hold off on that and continue cam boot as she is doing well with that otherwise.  -Ice/elevation -Pain medication as needed. -Monitor for any clinical signs or symptoms of infection and DVT/PE and directed to call the office immediately should any occur or go to the ER. -Follow-up as scheduled for possible stable removal or sooner if any problems arise. In the meantime, encouraged to call the  office with any questions, concerns, change in symptoms.   [name], DPM

## 2021-10-18 ENCOUNTER — Other Ambulatory Visit: Payer: Self-pay | Admitting: Podiatry

## 2021-10-18 ENCOUNTER — Encounter: Payer: Self-pay | Admitting: Podiatry

## 2021-10-18 MED ORDER — HYDROCODONE-ACETAMINOPHEN 5-325 MG PO TABS: 1.0000 | ORAL_TABLET | Freq: Four times a day (QID) | ORAL | 0 refills | Status: DC | PRN

## 2021-10-24 ENCOUNTER — Ambulatory Visit (INDEPENDENT_AMBULATORY_CARE_PROVIDER_SITE_OTHER): Payer: BLUE CROSS/BLUE SHIELD

## 2021-10-24 DIAGNOSIS — M76822 Posterior tibial tendinitis, left leg: Secondary | ICD-10-CM

## 2021-10-24 NOTE — Progress Notes (Signed)
[name] [name] is a 51 y.o. is seen today in office s/p left foot calcaneal osteotomy, posterior tibial tendon repair preformed on 10/02/2021.  States that she is doing good today.  Patient in office today to have staples and sutures removed from the left ankle.   Patient denies nausea, vomiting, fever, and chills at this visit.   Staples were removed from the medial aspect of the left foot and sutures were removed from the lateral aspect. After removed DSD applied to area. Patient tolerated well.   Advised patient to continue to be non-weight bearing until further notice by Dr. Jacqualyn Posey. Patient to follow-up with Dr. Jacqualyn Posey next week. In the meantime, encouraged to call the office with any questions, concerns, change in symptoms.   Patient verbalized understanding.

## 2021-10-31 ENCOUNTER — Ambulatory Visit (INDEPENDENT_AMBULATORY_CARE_PROVIDER_SITE_OTHER): Payer: BLUE CROSS/BLUE SHIELD | Admitting: Podiatry

## 2021-10-31 ENCOUNTER — Ambulatory Visit (INDEPENDENT_AMBULATORY_CARE_PROVIDER_SITE_OTHER): Payer: BLUE CROSS/BLUE SHIELD

## 2021-10-31 ENCOUNTER — Encounter: Payer: Self-pay | Admitting: Podiatry

## 2021-10-31 DIAGNOSIS — Z9889 Other specified postprocedural states: Secondary | ICD-10-CM

## 2021-10-31 DIAGNOSIS — M76822 Posterior tibial tendinitis, left leg: Secondary | ICD-10-CM

## 2021-10-31 DIAGNOSIS — M2142 Flat foot [pes planus] (acquired), left foot: Secondary | ICD-10-CM

## 2021-10-31 DIAGNOSIS — M2141 Flat foot [pes planus] (acquired), right foot: Secondary | ICD-10-CM

## 2021-11-02 NOTE — Progress Notes (Signed)
Subjective: Chief Complaint  Patient presents with   Routine Post Op     [name] [name] is a 51 y.o. is seen today in office s/p left foot calcaneal osteotomy, posterior tibial tendon repair preformed on 10/02/2021.  [name] [name] that she has been doing better.  Denies any fevers or chills.  Pain is controlled.  He has some discomfort on the Achilles tendon but she thinks it is coming from the boot.  She is still nonweightbearing.  No other concerns.    Objective: General: No acute distress, AAOx3  DP/PT pulses palpable 2/4, CRT < 3 sec to all digits.  Protective sensation intact. Motor function intact.  Left foot: Incision is well coapted without any evidence of dehiscence and scars are forming.  There is still some mild edema around the surgical sites but appears to be improving.  There is no drainage or pus or erythema or signs of infection.  No significant pain on exam.  Skin some pain on the Achilles tendon area but clinically the tendon appears to be intact, able to palpate all the borders of the Achilles tendon. No pain with calf compression, swelling, warmth, erythema.   Assessment and Plan:  Status post left foot surgery, doing well with no complications   -Treatment options discussed including all alternatives, risks, and complications -X-rays were obtained and reviewed.  Multiple views were obtained.  No acute fracture.  Hardware intact any complicating factors. -Incisions appear to be healing well.  Antibiotic ointment and bandage applied.  Discussed that she can start to wash the foot with soap and water, dry thoroughly and apply a similar bandage. -Remain in cam boot at all times -Continue nonweightbearing -Ice, elevation -Monitor for any clinical signs or symptoms of infection and DVT/PE and directed to call the office immediately should any occur or go to the ER. -Follow-up as scheduled for possible stable removal or sooner if any problems arise. In the meantime, encouraged to call  the office with any questions, concerns, change in symptoms.   [name], DPM

## 2021-11-13 ENCOUNTER — Telehealth: Payer: Self-pay

## 2021-11-14 ENCOUNTER — Encounter: Payer: Self-pay | Admitting: Podiatry

## 2021-11-14 ENCOUNTER — Ambulatory Visit (INDEPENDENT_AMBULATORY_CARE_PROVIDER_SITE_OTHER): Payer: BLUE CROSS/BLUE SHIELD | Admitting: Podiatry

## 2021-11-14 ENCOUNTER — Ambulatory Visit (INDEPENDENT_AMBULATORY_CARE_PROVIDER_SITE_OTHER): Payer: BLUE CROSS/BLUE SHIELD

## 2021-11-14 DIAGNOSIS — Z9889 Other specified postprocedural states: Secondary | ICD-10-CM

## 2021-11-14 DIAGNOSIS — M2141 Flat foot [pes planus] (acquired), right foot: Secondary | ICD-10-CM

## 2021-11-14 DIAGNOSIS — M76822 Posterior tibial tendinitis, left leg: Secondary | ICD-10-CM

## 2021-11-14 DIAGNOSIS — M2142 Flat foot [pes planus] (acquired), left foot: Secondary | ICD-10-CM

## 2021-11-14 NOTE — Telephone Encounter (Signed)
No further notes are needed for this encounter

## 2021-11-18 NOTE — Progress Notes (Signed)
Subjective: Chief Complaint  Patient presents with   Routine Post Op    POV #4 DOS 10/02/2021 REPAIR TENDON LT, EVANS CALCANEAL OSTEOTOMY LT- Patient stated her pain is about 3 out 10 pain scale. Patient is doing good.    [name] [name] is a 51 y.o. is seen today in office s/p left foot calcaneal osteotomy, posterior tibial tendon repair preformed on 10/02/2021.  She has been doing well.  Denies any fevers or chills.  No recent injuries.  Objective: General: No acute distress, AAOx3  DP/PT pulses palpable 2/4, CRT < 3 sec to all digits.  Protective sensation intact. Motor function intact.  Left foot: Incision is well coapted without any evidence of dehiscence and scars are forming.  There is still some mild but improved edema around the surgical sites.  There is no drainage or pus or erythema or signs of infection.  No significant pain on exam.  S today there is no pain along the Achilles tendon. Clinically the tendon appears to be intact, able to palpate all the borders of the Achilles tendon. No pain with calf compression, swelling, warmth, erythema.   Assessment and Plan:  Status post left foot surgery, doing well with no complications   -Treatment options discussed including all alternatives, risks, and complications -X-rays were obtained and reviewed.  Multiple views were obtained.  No acute fracture.  Hardware intact any complicating factors. -At this time discussed gradual transition to partial weightbearing but the cam boot.  Continue ice, elevate as well as compression of any postoperative edema. -Referral to physical therapy.  She can transition to weightbearing as tolerated when she starts physical therapy but remain in the cam boot until follow-up. -Monitor for any clinical signs or symptoms of infection and DVT/PE and directed to call the office immediately should any occur or go to the ER.   [name], DPM

## 2021-11-19 ENCOUNTER — Encounter: Payer: BLUE CROSS/BLUE SHIELD | Admitting: Podiatry

## 2021-11-27 ENCOUNTER — Other Ambulatory Visit: Payer: Self-pay | Admitting: Family

## 2021-11-27 DIAGNOSIS — F411 Generalized anxiety disorder: Secondary | ICD-10-CM

## 2021-12-02 ENCOUNTER — Encounter: Payer: Self-pay | Admitting: Podiatry

## 2021-12-12 ENCOUNTER — Ambulatory Visit: Payer: BLUE CROSS/BLUE SHIELD

## 2021-12-12 ENCOUNTER — Ambulatory Visit (INDEPENDENT_AMBULATORY_CARE_PROVIDER_SITE_OTHER): Payer: BLUE CROSS/BLUE SHIELD | Admitting: Podiatry

## 2021-12-12 DIAGNOSIS — M76822 Posterior tibial tendinitis, left leg: Secondary | ICD-10-CM

## 2021-12-12 DIAGNOSIS — Z9889 Other specified postprocedural states: Secondary | ICD-10-CM

## 2021-12-12 NOTE — Progress Notes (Signed)
Subjective: Chief Complaint  Patient presents with   Routine Post Op    POV #5 DOS 10/02/2021 REPAIR TENDON LT, EVANS CALCANEAL OSTEOTOMY LT   51 year old female presents with above concerns.  She has s/p left foot calcaneal osteotomy, posterior tibial tendon repair preformed on 10/02/2021.  She was having some calf pain she works for [employer] and an ultrasound performed which did reveal DVT.  She was started on anticoagulation.  This was doing better but still in some Pain.  Objective: General: No acute distress, AAOx3 -presents today walking in cam boot to crutches. DP/PT pulses palpable 2/4, CRT < 3 sec to all digits.  Protective sensation intact. Motor function intact.  Left foot: Incision is well coapted without any evidence of dehiscence and scars are forming.  There is still some mild but improved edema around the surgical sites.  Any signs of infection.  No significant pain on exam today.  No pain Achilles tendon. No pain with calf compression, swelling, warmth, erythema.   Assessment and Plan:  Status post left foot surgery, doing well with no complications   -Treatment options discussed including all alternatives, risks, and complications -X-rays were obtained and reviewed.  3 views were obtained.  No acute fracture.  Hardware intact any complicating factors.  Increased consolidation noted. -Plan to continue with physical therapy.  Discussed that she can weight-bear as tolerated in the cam boot.  Continue ice, elevate as well as compression of the residual edema. -Continue anticoagulation for DVT  Trula Slade DPM

## 2021-12-23 ENCOUNTER — Telehealth: Payer: Self-pay | Admitting: *Deleted

## 2021-12-23 NOTE — Telephone Encounter (Signed)
Patient is wanting to know when she may walk w/o crutches as long as she is wearing boot,thought it was mentioned during visit a couple of weeks.  Please advise.

## 2021-12-24 NOTE — Telephone Encounter (Signed)
Called patient no answer,left voice message of recommendations per physician.

## 2021-12-30 ENCOUNTER — Ambulatory Visit (INDEPENDENT_AMBULATORY_CARE_PROVIDER_SITE_OTHER): Payer: BLUE CROSS/BLUE SHIELD

## 2021-12-30 ENCOUNTER — Ambulatory Visit (INDEPENDENT_AMBULATORY_CARE_PROVIDER_SITE_OTHER): Payer: BLUE CROSS/BLUE SHIELD | Admitting: Podiatry

## 2021-12-30 DIAGNOSIS — M7752 Other enthesopathy of left foot: Secondary | ICD-10-CM

## 2021-12-30 DIAGNOSIS — Z9889 Other specified postprocedural states: Secondary | ICD-10-CM

## 2021-12-30 DIAGNOSIS — M76822 Posterior tibial tendinitis, left leg: Secondary | ICD-10-CM

## 2022-01-01 NOTE — Progress Notes (Signed)
Subjective: Chief Complaint  Patient presents with   Foot Pain    Left foot Calcaneal osteotomy and tendon repair, patient is still having pain and states she is feeling the screw in the ankle, rate of pain 8 out of 10, swelling,TX: icing, cam boot X-Rays done today    [name] [name] is a 51 y.o. is seen today in office s/p left foot calcaneal osteotomy, posterior tibial tendon repair preformed on 10/02/2021.  She presents today for an acute appointment as she states that over the last week and a half she has had some increased discomfort she has been walking in the boot more.  She feels the screw may be in the ankle.  No recent injury or falls that she reports.  Objective: General: No acute distress, AAOx3  DP/PT pulses palpable 2/4, CRT < 3 sec to all digits.  Protective sensation intact. Motor function intact.  Left foot: Incision is well coapted without any evidence of dehiscence and scars have formed.  There is tenderness palpation along the lateral aspect of the sinus tarsi there is also tenderness the posterior aspect of calcaneus.  Not able to palpate any hardware in all the incisions of healed.  There is still some edema present there is no erythema or warmth or any obvious signs of infection noted today.  Pain with Achilles tendon.  She is walking in the cam boot today.   No pain with calf compression, swelling, warmth, erythema.   Assessment and Plan:  Status post left foot surgery, doing well with no complications   -Treatment options discussed including all alternatives, risks, and complications -X-rays were obtained and reviewed.  Multiple views were obtained.  No acute fracture.  Hardware intact any complicating factors. -I do think it is coming from postoperative swelling as well as trying to increase her walking is when the symptoms worsened.  Discussed continue ice, elevation as well as compression.  Continue topical Voltaren as she cannot take oral anti-inflammatories.  If  needed we can take a step back and use the crutches to take some pressure off the foot.  Continue physical therapy however we may need to decrease.  Trula Slade DPM

## 2022-01-10 ENCOUNTER — Encounter: Payer: BLUE CROSS/BLUE SHIELD | Admitting: Podiatry

## 2022-01-17 ENCOUNTER — Ambulatory Visit (INDEPENDENT_AMBULATORY_CARE_PROVIDER_SITE_OTHER): Payer: BLUE CROSS/BLUE SHIELD | Admitting: Podiatry

## 2022-01-17 DIAGNOSIS — M76822 Posterior tibial tendinitis, left leg: Secondary | ICD-10-CM | POA: Diagnosis not present

## 2022-01-17 DIAGNOSIS — M7752 Other enthesopathy of left foot: Secondary | ICD-10-CM

## 2022-01-17 MED ORDER — METHYLPREDNISOLONE 4 MG PO TBPK: ORAL_TABLET | ORAL | 0 refills | Status: DC

## 2022-01-17 NOTE — Progress Notes (Unsigned)
Subjective: Chief Complaint  Patient presents with   Routine Post Op    Patient is still having pain in the left foot, rate of pain 4 out of 10.    [name] [name] is a 51 y.o. is seen today in office s/p left foot calcaneal osteotomy, posterior tibial tendon repair preformed on 10/02/2021.  Overall she states that she is doing about the same.  She is wearing regular shoes and she is still doing physical therapy but is typical for her to do this twice a week.  No recent injury or changes.  No fevers or chills.   Objective: General: No acute distress, AAOx3  DP/PT pulses palpable 2/4, CRT < 3 sec to all digits.  Protective sensation intact. Motor function intact.  Left foot: Incision is well coapted without any evidence of dehiscence and scars have formed.  Not able to appreciate any significant area of tenderness noted today.  No erythema or tenderness.  Still some edema present she still has discomfort along the sinus tarsi working is although not able to appreciate any tenderness of the posterior calcaneus.  There is no open lesions.  There is no erythema or warmth.  Signs of infection noted today. No pain with calf compression, swelling, warmth, erythema.   Assessment and Plan:  Status post left foot surgery, doing well with no complications   -Treatment options discussed including all alternatives, risks, and complications -Overall she seems to be about the same she is wearing regular shoes but we discussed changing shoes more of a lace up style today.  Prescribed a Medrol Dosepak to help since she cannot do anti-inflammatories.  Continue icing, elevation as well as compression.  Continue physical therapy.  Return in about 4 weeks (around 02/14/2022).  X-ray next appointment.  Include calcaneal axial.  Trula Slade DPM

## 2022-02-04 ENCOUNTER — Ambulatory Visit (INDEPENDENT_AMBULATORY_CARE_PROVIDER_SITE_OTHER): Payer: BLUE CROSS/BLUE SHIELD

## 2022-02-04 ENCOUNTER — Ambulatory Visit (INDEPENDENT_AMBULATORY_CARE_PROVIDER_SITE_OTHER): Payer: BLUE CROSS/BLUE SHIELD | Admitting: Podiatry

## 2022-02-04 ENCOUNTER — Ambulatory Visit: Payer: BLUE CROSS/BLUE SHIELD

## 2022-02-04 DIAGNOSIS — M76822 Posterior tibial tendinitis, left leg: Secondary | ICD-10-CM

## 2022-02-04 DIAGNOSIS — M2141 Flat foot [pes planus] (acquired), right foot: Secondary | ICD-10-CM

## 2022-02-04 DIAGNOSIS — Z9889 Other specified postprocedural states: Secondary | ICD-10-CM | POA: Diagnosis not present

## 2022-02-04 DIAGNOSIS — M2142 Flat foot [pes planus] (acquired), left foot: Secondary | ICD-10-CM

## 2022-02-04 MED ORDER — GABAPENTIN 100 MG PO CAPS: 100.0000 mg | ORAL_CAPSULE | Freq: Three times a day (TID) | ORAL | 0 refills | Status: DC

## 2022-02-04 NOTE — Patient Instructions (Signed)
For inserts I like [employer], [place], [place]

## 2022-02-04 NOTE — Progress Notes (Signed)
Subjective: Chief Complaint  Patient presents with   Foot Problem    Patient came in today for a left foot and ankle follow-up, patient is still having some pain, shooting pain, on the medial side of the ankle, rate of pain 4 out of 10, X-Rays taken today     [name] [name] is a 51 y.o. is seen today in office s/p left foot calcaneal osteotomy, posterior tibial tendon repair preformed on 10/02/2021.  She states the steroid did not help.  She describes shooting pain while on the incision.  No recent injuries.  She still doing therapy however she not been able to go recently.  She also feels that the wound therapy has not been very helpful as she is doing the same exercises at home.  No fevers or chills.  Objective: General: No acute distress, AAOx3  DP/PT pulses palpable 2/4, CRT < 3 sec to all digits.  Protective sensation intact. Motor function intact.  Left foot: Incision is well coapted without any evidence of dehiscence and scars have formed.  There is no significant tenderness palpation.  There is no area pinpoint tenderness.  There is still some slight edema present there is no erythema or warmth.  No Tinel sign. No pain with calf compression, swelling, warmth, erythema.   Assessment and Plan:  Status post left foot surgery, doing well with no complications   -Treatment options discussed including all alternatives, risks, and complications -Given more nervelike symptoms when to start gabapentin -Will refer to another PT if needed- continue with home PT -Unfortunately need to hold off on oral anti-inflammatories given blood thinners although this would be helpful for her. -Continue shoes with good arch support.  Return in about 6 weeks (around 03/18/2022). X-ray next appointment.  Include calcaneal axial.  Trula Slade DPM

## 2022-03-13 ENCOUNTER — Encounter: Payer: Self-pay | Admitting: Podiatry

## 2022-03-13 ENCOUNTER — Other Ambulatory Visit: Payer: Self-pay | Admitting: Podiatry

## 2022-03-21 ENCOUNTER — Ambulatory Visit: Payer: BLUE CROSS/BLUE SHIELD | Admitting: Podiatry

## 2022-04-08 ENCOUNTER — Encounter: Payer: Self-pay | Admitting: Podiatry

## 2022-04-08 ENCOUNTER — Encounter: Payer: Self-pay | Admitting: Family

## 2022-04-08 ENCOUNTER — Ambulatory Visit (INDEPENDENT_AMBULATORY_CARE_PROVIDER_SITE_OTHER): Payer: BLUE CROSS/BLUE SHIELD | Admitting: Podiatry

## 2022-04-08 ENCOUNTER — Ambulatory Visit (INDEPENDENT_AMBULATORY_CARE_PROVIDER_SITE_OTHER): Payer: BLUE CROSS/BLUE SHIELD

## 2022-04-08 DIAGNOSIS — M76822 Posterior tibial tendinitis, left leg: Secondary | ICD-10-CM

## 2022-04-08 DIAGNOSIS — M2142 Flat foot [pes planus] (acquired), left foot: Secondary | ICD-10-CM

## 2022-04-08 DIAGNOSIS — Z9889 Other specified postprocedural states: Secondary | ICD-10-CM

## 2022-04-08 NOTE — Progress Notes (Signed)
Subjective: Chief Complaint  Patient presents with   Follow-up    DOS 10/02/2021 REPAIR TENDON LT, EVANS CALCANEAL OSTEOTOMY LT    [name] [name] is a 52 y.o. is seen today in office s/p left foot calcaneal osteotomy, posterior tibial tendon repair preformed on 10/02/2021.  She states she feels making progress.  She is has some limited range of motion at end range of motion she gets discomfort but otherwise has been doing well.  She is wearing regular shoes and orthotics which seem to help.  She is now able to take anti-inflammatories with this is also helpful.  She is still taking gabapentin.  She states that prior to surgery patient was improved in regards to swelling which allows her to walk more.  Objective: General: No acute distress, AAOx3  DP/PT pulses palpable 2/4, CRT < 3 sec to all digits.  Protective sensation intact. Motor function intact.  Left foot: Incision is well coapted without any evidence of dehiscence and scars have formed.  There is no significant pain on exam.  Some mild discomfort and range of motion of inversion and slightly restricted.  Trace edema there is no erythema or warmth. No pain with calf compression, swelling, warmth, erythema.   Assessment and Plan:  Status post left foot surgery, doing well with no complications   -Treatment options discussed including all alternatives, risks, and complications -X-rays were obtained reviewed.  3 views of the foot were obtained.  Hardware intact right complicating factors. -At this point encouraged to continue home range of motion exercises, she has a good arch support.  Anti-inflammatories as needed.  She continue gabapentin as needed as well. -She seems to be making some progress.  Continue with this.  I discussed with her was given a couple more months and should there be any issues or continued pain or restriction of motion let me know.  Will see her back as needed she agrees with plans no further questions.  Trula Slade DPM

## 2022-04-20 ENCOUNTER — Other Ambulatory Visit: Payer: Self-pay | Admitting: Family

## 2022-04-20 DIAGNOSIS — F411 Generalized anxiety disorder: Secondary | ICD-10-CM

## 2022-05-05 ENCOUNTER — Other Ambulatory Visit: Payer: Self-pay | Admitting: Family

## 2022-05-05 DIAGNOSIS — F411 Generalized anxiety disorder: Secondary | ICD-10-CM

## 2022-05-07 ENCOUNTER — Other Ambulatory Visit: Payer: Self-pay | Admitting: Podiatry

## 2022-06-05 ENCOUNTER — Telehealth: Payer: Self-pay | Admitting: Family

## 2022-06-05 ENCOUNTER — Other Ambulatory Visit: Payer: Self-pay | Admitting: Family

## 2022-06-05 NOTE — Telephone Encounter (Signed)
Called pt and left a VM asking pt to call the office back to get her an appt scheduled before any further refills can be sent in.

## 2022-06-05 NOTE — Telephone Encounter (Signed)
She must schedule for in person visit for further refills.

## 2022-06-09 NOTE — Telephone Encounter (Signed)
Called pt again and left a VM

## 2022-06-10 NOTE — Telephone Encounter (Signed)
Letter has been placed up front to mail out to pt.

## 2022-07-10 ENCOUNTER — Other Ambulatory Visit: Payer: Self-pay | Admitting: Family

## 2022-08-04 ENCOUNTER — Other Ambulatory Visit: Payer: Self-pay | Admitting: Family

## 2022-08-07 ENCOUNTER — Ambulatory Visit (INDEPENDENT_AMBULATORY_CARE_PROVIDER_SITE_OTHER): Payer: BLUE CROSS/BLUE SHIELD | Admitting: Family

## 2022-08-07 ENCOUNTER — Other Ambulatory Visit: Payer: Self-pay | Admitting: Family

## 2022-08-07 ENCOUNTER — Encounter: Payer: Self-pay | Admitting: Family

## 2022-08-07 VITALS — BP 134/82 | HR 75 | Ht 66.0 in | Wt 259.0 lb

## 2022-08-07 DIAGNOSIS — Z1211 Encounter for screening for malignant neoplasm of colon: Secondary | ICD-10-CM

## 2022-08-07 DIAGNOSIS — R0602 Shortness of breath: Secondary | ICD-10-CM

## 2022-08-07 DIAGNOSIS — Z0000 Encounter for general adult medical examination without abnormal findings: Secondary | ICD-10-CM

## 2022-08-07 DIAGNOSIS — F411 Generalized anxiety disorder: Secondary | ICD-10-CM

## 2022-08-07 DIAGNOSIS — Z1231 Encounter for screening mammogram for malignant neoplasm of breast: Secondary | ICD-10-CM | POA: Diagnosis not present

## 2022-08-07 DIAGNOSIS — R0781 Pleurodynia: Secondary | ICD-10-CM

## 2022-08-07 MED ORDER — BUSPIRONE HCL 5 MG PO TABS: 5.0000 mg | ORAL_TABLET | Freq: Three times a day (TID) | ORAL | 3 refills | Status: AC | PRN

## 2022-08-07 MED ORDER — SUMATRIPTAN SUCCINATE 100 MG PO TABS: 100.0000 mg | ORAL_TABLET | ORAL | 0 refills | Status: DC | PRN

## 2022-08-07 MED ORDER — GABAPENTIN 100 MG PO CAPS: ORAL_CAPSULE | ORAL | 0 refills | Status: AC

## 2022-08-07 MED ORDER — ZOLPIDEM TARTRATE 10 MG PO TABS
ORAL_TABLET | ORAL | 1 refills | Status: DC
Start: 2022-08-07 — End: 2023-03-30

## 2022-08-07 MED ORDER — ALBUTEROL SULFATE HFA 108 (90 BASE) MCG/ACT IN AERS: INHALATION_SPRAY | RESPIRATORY_TRACT | 1 refills | Status: DC

## 2022-08-07 MED ORDER — VALACYCLOVIR HCL 1 G PO TABS: 1000.0000 mg | ORAL_TABLET | Freq: Every day | ORAL | 0 refills | Status: DC

## 2022-08-07 NOTE — Progress Notes (Signed)
[name] [name] is a 52 y.o. female with the following history as recorded in Epic[employer]:  Patient Active Problem List   Diagnosis Date Noted   Left knee injury 01/10/2015    Current Outpatient Medications  Medication Sig Dispense Refill   ibuprofen (ADVIL) 800 MG tablet TAKE 1 TABLET BY MOUTH EVERY 8 HOURS AS NEEDED 30 tablet 0   albuterol (VENTOLIN HFA) 108 (90 Base) MCG/ACT inhaler TAKE 2 PUFFS BY MOUTH EVERY 6 HOURS AS NEEDED FOR WHEEZE OR SHORTNESS OF BREATH 18 g 1   busPIRone (BUSPAR) 5 MG tablet Take 1 tablet (5 mg total) by mouth 3 (three) times daily as needed. 270 tablet 3   gabapentin (NEURONTIN) 100 MG capsule TAKE 1 CAPSULE (100 MG TOTAL) BY MOUTH 3 TIMES A DAY 90 capsule 0   SUMAtriptan (IMITREX) 100 MG tablet Take 1 tablet (100 mg total) by mouth every 2 (two) hours as needed. 10 tablet 0   valACYclovir (VALTREX) 1000 MG tablet Take 1 tablet (1,000 mg total) by mouth daily. 90 tablet 0   zolpidem (AMBIEN) 10 MG tablet TAKE 1 TABLET(10 MG) BY MOUTH AT BEDTIME AS NEEDED FOR SLEEP 30 tablet 1   No current facility-administered medications for this visit.    Allergies: Patient has no known allergies.  Past Medical History:  Diagnosis Date   Anxiety    Cancer (HCC) 1995   Herpes genitalis in women    Migraine     Past Surgical History:  Procedure Laterality Date   ANTERIOR CRUCIATE LIGAMENT REPAIR     CESAREAN SECTION     TUBAL LIGATION      Family History  Problem Relation Age of Onset   Healthy Mother    Arthritis Mother    Hearing loss Mother    Hypertension Mother    Heart disease Father    Diabetes Father    Arthritis Father    Alcohol abuse Father    COPD Father    Hearing loss Father    Hypertension Father    Kidney disease Father    Hyperlipidemia Father    Diabetes Maternal Grandmother    Arthritis Maternal Grandmother    Asthma Maternal Grandmother    Cancer Maternal Grandmother    Heart disease Maternal Grandmother    Hypertension Maternal  Grandmother    Arthritis Maternal Grandfather    Alcohol abuse Maternal Grandfather    Diabetes Maternal Grandfather    Hearing loss Maternal Grandfather    Heart disease Maternal Grandfather    Hyperlipidemia Maternal Grandfather    Hypertension Maternal Grandfather    Kidney disease Maternal Grandfather    Heart disease Paternal Grandmother    Arthritis Paternal Grandmother    Cancer Paternal Grandmother    Diabetes Paternal Grandmother    Hyperlipidemia Paternal Grandmother    Hypertension Paternal Grandmother    Stroke Paternal Grandmother    Heart disease Paternal Grandfather    Arthritis Paternal Grandfather    Cancer Paternal Grandfather    COPD Paternal Grandfather    Diabetes Paternal Grandfather    Hearing loss Paternal Grandfather    Hyperlipidemia Paternal Grandfather    Hypertension Paternal Grandfather    Kidney disease Paternal Grandfather    Arthritis Sister    Asthma Sister    Miscarriages / [place] Sister    Arthritis Brother    Asthma Son    Arthritis Sister    Depression Sister    Hearing loss Sister    Asthma Sister    Cancer  Sister    Arthritis Brother    Diabetes Brother    Hyperlipidemia Brother    Hypertension Brother     Social History   Tobacco Use   Smoking status: Never   Smokeless tobacco: Never  Substance Use Topics   Alcohol use: Yes    Alcohol/week: 1.0 standard drink of alcohol    Types: 1 Glasses of wine per week    Subjective:   Presents for yearly CPE; has only been seen in our office in person on one occasion/ virtual visit was done in March 2023 and unfortunately patient was not able to follow up as requested in 2023.  Continuing to struggle with chronic left foot/ ankle pain- did have surgery in the past year but still not healed as hoped/ planning to see podiatrist in follow up;  Defers pap smear today- would like to do at later date; is not fasting today and prefers to come back at later date;   Review of Systems   Constitutional: Negative.   HENT: Negative.    Eyes: Negative.   Respiratory: Negative.    Cardiovascular: Negative.   Gastrointestinal: Negative.   Genitourinary: Negative.   Musculoskeletal: Negative.   Skin: Negative.   Neurological: Negative.   Endo/Heme/Allergies: Negative.   Psychiatric/Behavioral: Negative.         Objective:  Vitals:   08/07/22 0914  BP: 134/82  Pulse: 75  SpO2: 98%  Weight: 259 lb (117.5 kg)  Height: 5\' 6"  (1.676 m)    General: Well developed, well nourished, in no acute distress  Skin : Warm and dry.  Head: Normocephalic and atraumatic  Eyes: Sclera and conjunctiva clear; pupils round and reactive to light; extraocular movements intact  Ears: External normal; canals clear; tympanic membranes normal  Oropharynx: Pink, supple. No suspicious lesions  Neck: Supple without thyromegaly, adenopathy  Lungs: Respirations unlabored; clear to auscultation bilaterally without wheeze, rales, rhonchi  CVS exam: normal rate and regular rhythm.  Abdomen: Soft; nontender; nondistended; normoactive bowel sounds; no masses or hepatosplenomegaly  Musculoskeletal: No deformities; no active joint inflammation  Extremities: No edema, cyanosis, clubbing  Vessels: Symmetric bilaterally  Neurologic: Alert and oriented; speech intact; face symmetrical; moves all extremities well; CNII-XII intact without focal deficit   Assessment:  1. PE (physical exam), annual   2. Encounter for screening colonoscopy   3. Visit for screening mammogram   4. Shortness of breath   5. Pleuritic pain   6. Anxiety state     Plan:  Age appropriate preventive healthcare needs addressed; encouraged regular eye doctor and dental exams; encouraged regular exercise; will update refills as needed today; follow-up to be determined; Order updated for colonoscopy/ mammogram; she will return for fasting labs and pap smear at a later date;    No follow-ups on file.  Orders Placed This  Encounter  Procedures   MM Digital Screening    Standing Status:   Future    Standing Expiration Date:   08/07/2023    Order Specific Question:   Reason for Exam (SYMPTOM  OR DIAGNOSIS REQUIRED)    Answer:   screening mammogram    Order Specific Question:   Is the patient pregnant?    Answer:   No    Order Specific Question:   Preferred imaging location?    Answer:   [facility]   Ambulatory referral to Gastroenterology    Referral Priority:   Routine    Referral Type:   Consultation    Referral Reason:  Specialty Services Required    Number of Visits Requested:   1    Requested Prescriptions   Signed Prescriptions Disp Refills   albuterol (VENTOLIN HFA) 108 (90 Base) MCG/ACT inhaler 18 g 1    Sig: TAKE 2 PUFFS BY MOUTH EVERY 6 HOURS AS NEEDED FOR WHEEZE OR SHORTNESS OF BREATH   busPIRone (BUSPAR) 5 MG tablet 270 tablet 3    Sig: Take 1 tablet (5 mg total) by mouth 3 (three) times daily as needed.   SUMAtriptan (IMITREX) 100 MG tablet 10 tablet 0    Sig: Take 1 tablet (100 mg total) by mouth every 2 (two) hours as needed.   valACYclovir (VALTREX) 1000 MG tablet 90 tablet 0    Sig: Take 1 tablet (1,000 mg total) by mouth daily.   zolpidem (AMBIEN) 10 MG tablet 30 tablet 1    Sig: TAKE 1 TABLET(10 MG) BY MOUTH AT BEDTIME AS NEEDED FOR SLEEP   gabapentin (NEURONTIN) 100 MG capsule 90 capsule 0    Sig: TAKE 1 CAPSULE (100 MG TOTAL) BY MOUTH 3 TIMES A DAY

## 2022-08-07 NOTE — Patient Instructions (Signed)
Please get your mammogram scheduled and then call back to do your fasting follow up on the same day to get your pap smear/ fasting labs;

## 2022-08-27 ENCOUNTER — Telehealth (HOSPITAL_BASED_OUTPATIENT_CLINIC_OR_DEPARTMENT_OTHER): Payer: Self-pay

## 2022-11-27 ENCOUNTER — Other Ambulatory Visit: Payer: Self-pay | Admitting: Family

## 2023-01-31 ENCOUNTER — Other Ambulatory Visit: Payer: Self-pay | Admitting: Family

## 2023-03-30 ENCOUNTER — Other Ambulatory Visit: Payer: Self-pay | Admitting: Family

## 2023-03-30 DIAGNOSIS — F411 Generalized anxiety disorder: Secondary | ICD-10-CM

## 2023-03-30 NOTE — Telephone Encounter (Signed)
 Requesting: Ambien  Contract: None UDS: None Last Visit: 08/07/22 Next Visit: None Last Refill: 08/07/22 #30 and 1RF   Please Advise

## 2023-04-25 ENCOUNTER — Other Ambulatory Visit: Payer: Self-pay | Admitting: Family

## 2023-04-29 ENCOUNTER — Other Ambulatory Visit: Payer: Self-pay | Admitting: Family

## 2023-04-29 DIAGNOSIS — R0781 Pleurodynia: Secondary | ICD-10-CM

## 2023-04-29 DIAGNOSIS — R0602 Shortness of breath: Secondary | ICD-10-CM

## 2023-05-07 ENCOUNTER — Other Ambulatory Visit: Payer: Self-pay | Admitting: Family

## 2023-05-30 ENCOUNTER — Other Ambulatory Visit: Payer: Self-pay | Admitting: Family
